# Patient Record
Sex: Male | Born: 1985 | Hispanic: No | Marital: Single | Smoking: Current every day smoker
Health system: Southern US, Community
[De-identification: ages and names within clinical notes are randomized; demographics above are authoritative.]

## PROBLEM LIST (undated history)

## (undated) DIAGNOSIS — J40 Bronchitis, not specified as acute or chronic: Secondary | ICD-10-CM

## (undated) DIAGNOSIS — D649 Anemia, unspecified: Secondary | ICD-10-CM

## (undated) DIAGNOSIS — Z720 Tobacco use: Secondary | ICD-10-CM

## (undated) HISTORY — PX: NO PAST SURGERIES: SHX2092

---
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
## 2006-04-12 ENCOUNTER — Emergency Department (HOSPITAL_COMMUNITY): Admission: EM | Admit: 2006-04-12 | Discharge: 2006-04-12 | Payer: Self-pay | Admitting: Emergency Medicine

## 2006-04-15 ENCOUNTER — Ambulatory Visit: Payer: Self-pay | Admitting: Family Medicine

## 2006-10-04 ENCOUNTER — Emergency Department (HOSPITAL_COMMUNITY): Admission: EM | Admit: 2006-10-04 | Discharge: 2006-10-04 | Payer: Self-pay | Admitting: Emergency Medicine

## 2007-02-18 ENCOUNTER — Emergency Department (HOSPITAL_COMMUNITY): Admission: EM | Admit: 2007-02-18 | Discharge: 2007-02-18 | Payer: Self-pay | Admitting: Family Medicine

## 2007-02-24 DIAGNOSIS — L708 Other acne: Secondary | ICD-10-CM | POA: Insufficient documentation

## 2007-02-24 DIAGNOSIS — F17200 Nicotine dependence, unspecified, uncomplicated: Secondary | ICD-10-CM | POA: Insufficient documentation

## 2007-02-24 DIAGNOSIS — J309 Allergic rhinitis, unspecified: Secondary | ICD-10-CM | POA: Insufficient documentation

## 2011-11-07 ENCOUNTER — Emergency Department (HOSPITAL_COMMUNITY)
Admission: EM | Admit: 2011-11-07 | Discharge: 2011-11-07 | Disposition: A | Discharge status: Home / Self Care | Payer: Self-pay | Attending: Emergency Medicine | Admitting: Emergency Medicine

## 2011-11-07 ENCOUNTER — Encounter: Payer: Self-pay | Admitting: *Deleted

## 2011-11-07 DIAGNOSIS — M79609 Pain in unspecified limb: Secondary | ICD-10-CM | POA: Insufficient documentation

## 2011-11-07 DIAGNOSIS — K625 Hemorrhage of anus and rectum: Secondary | ICD-10-CM | POA: Insufficient documentation

## 2011-11-07 DIAGNOSIS — F17200 Nicotine dependence, unspecified, uncomplicated: Secondary | ICD-10-CM | POA: Insufficient documentation

## 2011-11-07 DIAGNOSIS — M549 Dorsalgia, unspecified: Secondary | ICD-10-CM

## 2011-11-07 DIAGNOSIS — M545 Low back pain, unspecified: Secondary | ICD-10-CM | POA: Insufficient documentation

## 2011-11-07 LAB — POCT I-STAT, CHEM 8
BUN: 10 mg/dL (ref 6–23)
Calcium, Ion: 1.17 mmol/L (ref 1.12–1.32)
Creatinine, Ser: 1.1 mg/dL (ref 0.50–1.35)
Sodium: 140 mEq/L (ref 135–145)
TCO2: 26 mmol/L (ref 0–100)

## 2011-11-07 LAB — CBC
HCT: 42.4 % (ref 39.0–52.0)
Hemoglobin: 14.6 g/dL (ref 13.0–17.0)
MCH: 30.8 pg (ref 26.0–34.0)
MCV: 89.5 fL (ref 78.0–100.0)
RBC: 4.74 MIL/uL (ref 4.22–5.81)

## 2011-11-07 MED ORDER — OXYCODONE-ACETAMINOPHEN 5-325 MG PO TABS
2.0000 | ORAL_TABLET | Freq: Once | ORAL | Status: AC
  Administered 2011-11-07: 2 via ORAL
  Filled 2011-11-07 (×2): qty 1

## 2011-11-07 MED ORDER — OXYCODONE-ACETAMINOPHEN 5-325 MG PO TABS: 1.0000 | ORAL_TABLET | ORAL | Status: AC | PRN

## 2011-11-07 NOTE — ED Notes (Signed)
Pt started having back pain 3-4 days and took ibuprofen for it.  Then started having rectal bleeding yesterday and had another episode with clots yesterday.  Pt has hx of hemorrhoids.  Pt continues to have lower back pain that shoots down to knee caps

## 2011-11-07 NOTE — ED Provider Notes (Signed)
History     CSN: [id number] Arrival date & time: 11/07/2011 12:00 PM   First MD Initiated Contact with Patient 11/07/11 1523      Chief Complaint  Patient presents with  . Rectal Bleeding  . Back Pain    HPI Comments: Pt reports back pain for several days, no known injury No fever No h/o surgery to back Reports mild abd discomfort Also reports that he has had blood in his stool - no melena, no vomiting.  Does report h/o hemorrhoids  Patient is a 25 y.o. male presenting with back pain. The history is provided by the patient.  Back Pain  This is a new problem. The current episode started more than 2 days ago. The problem occurs hourly. The problem has been gradually worsening. The pain is associated with no known injury. The pain is present in the lumbar spine. The quality of the pain is described as shooting. The pain radiates to the right thigh and left thigh. The pain is moderate. The symptoms are aggravated by bending and certain positions. Associated symptoms include abdominal pain. Pertinent negatives include no chest pain, no fever, no numbness, no bowel incontinence, no perianal numbness, no bladder incontinence, no dysuria, no pelvic pain, no paresthesias, no paresis and no weakness.    PMH - none   History reviewed. No pertinent past surgical history.  No family history on file.  History  Substance Use Topics  . Smoking status: Current Everyday Smoker  . Smokeless tobacco: Not on file  . Alcohol Use: Yes     social      Review of Systems  Constitutional: Negative for fever.  Cardiovascular: Negative for chest pain.  Gastrointestinal: Positive for abdominal pain. Negative for bowel incontinence.  Genitourinary: Negative for bladder incontinence, dysuria and pelvic pain.  Musculoskeletal: Positive for back pain.  Neurological: Negative for weakness, numbness and paresthesias.  All other systems reviewed and are negative.    Allergies  Review of patient's  allergies indicates no known allergies.  Home Medications   Current Outpatient Rx  Name Route Sig Dispense Refill  . IBUPROFEN 200 MG PO TABS Oral Take 200 mg by mouth every 6 (six) hours as needed. For pain        BP 130/65  Pulse 84  Temp(Src) 98 F (36.7 C) (Oral)  Resp 20  SpO2 100%  Physical Exam  CONSTITUTIONAL: Well developed/well nourished HEAD AND FACE: Normocephalic/atraumatic EYES: EOMI/PERRL, conjunctiva pink ENMT: Mucous membranes moist NECK: supple no meningeal signs SPINE:thoracic spine nontender, lumbar spine tender, no bruising/crepitance CV: S1/S2 noted, no murmurs/rubs/gallops noted LUNGS: Lungs are clear to auscultation bilaterally, no apparent distress ABDOMEN: soft, nontender, no rebound or guarding GU:no cva tenderness Rectal - chaperone present, no melena, stool color normal, +hemoccult, +hemorrhoid noted that is not actively bleeding NEURO: Awake/alert, no saddle anesthesia, rectal tone present (chaperone present), equal distal motor: hip flexion/knee flexion/extension, ankle dorsi/plantar flexion, great toe extension intact bilaterally,no apparent sensory deficit in any dermatome.  Pt is able to ambulate. EXTREMITIES: pulses normal, full ROM SKIN: warm, color normal PSYCH: no abnormalities of mood noted   ED Course  Procedures (including critical care time)  Labs Reviewed  CBC - Abnormal; Notable for the following:    WBC 3.4 (*)    Platelets 127 (*)    All other components within normal limits  POCT I-STAT, CHEM 8 - Abnormal; Notable for the following:    Glucose, Bld 126 (*)    All other components within normal  limits  I-STAT, CHEM 8    3:45 PM Pt well appearing, no distress, suspicion for serious GI bleed is low Back pain likely due to muscle strain, no neuro deficits, otherwise healthy patient  4:40 PM Pt improved I told him to stop ibuprofen Also - he reports this pain started when bending over last week Discussed strict return  precautions  MDM  All labs/vitals reviewed and considered           Joya Gaskins, MD 11/07/11 [phone]

## 2018-02-23 ENCOUNTER — Emergency Department
Admission: EM | Admit: 2018-02-23 | Discharge: 2018-02-23 | Disposition: A | Discharge status: Home / Self Care | Payer: Self-pay | Attending: Emergency Medicine | Admitting: Emergency Medicine

## 2018-02-23 ENCOUNTER — Encounter: Payer: Self-pay | Admitting: Emergency Medicine

## 2018-02-23 ENCOUNTER — Emergency Department: Payer: Self-pay

## 2018-02-23 DIAGNOSIS — J111 Influenza due to unidentified influenza virus with other respiratory manifestations: Secondary | ICD-10-CM | POA: Insufficient documentation

## 2018-02-23 DIAGNOSIS — F17200 Nicotine dependence, unspecified, uncomplicated: Secondary | ICD-10-CM | POA: Insufficient documentation

## 2018-02-23 DIAGNOSIS — R69 Illness, unspecified: Secondary | ICD-10-CM

## 2018-02-23 HISTORY — DX: Bronchitis, not specified as acute or chronic: J40

## 2018-02-23 MED ORDER — GUAIFENESIN-CODEINE 100-10 MG/5ML PO SOLN: 5.0000 mL | ORAL | 0 refills | Status: DC | PRN

## 2018-02-23 MED ORDER — ACETAMINOPHEN 500 MG PO TABS
1000.0000 mg | ORAL_TABLET | Freq: Once | ORAL | Status: AC
  Administered 2018-02-23: 1000 mg via ORAL
  Filled 2018-02-23: qty 2

## 2018-02-23 NOTE — ED Notes (Signed)
Here for flu like symptoms including body aches, cough, congestion, subjective fever. Daughter dx with flu last night.

## 2018-02-23 NOTE — ED Provider Notes (Signed)
[facility] Emergency Department Provider Note  ____________________________________________   First MD Initiated Contact with Patient 02/23/18 1247     (approximate)  I have reviewed the triage vital signs and the nursing notes.   HISTORY  Chief Complaint Generalized Body Aches   HPI Douglas Patton is a 32 y.o. male is here with complaint of cough and congestion that started 3 days ago.  Patient has felt feverish and had chills.  He has been taken DayQuil and NyQuil at home.  Patient states that his daughter was recently diagnosed with the flu and he has similar symptoms.  Patient last took DayQuil at 8 AM this morning.  He denies any vomiting or diarrhea.  Patient is a smoker but states he has not smoked since he has been sick.  He rates his pain is not over 10.   Past Medical History:  Diagnosis Date  . Bronchitis     Patient Active Problem List   Diagnosis Date Noted  . TOBACCO DEPENDENCE 02/24/2007  . RHINITIS, ALLERGIC 02/24/2007  . ACNE 02/24/2007    History reviewed. No pertinent surgical history.  Prior to Admission medications   Medication Sig Start Date End Date Taking? Authorizing Provider  guaiFENesin-codeine 100-10 MG/5ML syrup Take 5 mLs by mouth every 4 (four) hours as needed. 02/23/18   Johnn Hai, PA-C  ibuprofen (ADVIL,MOTRIN) 200 MG tablet Take 200 mg by mouth every 6 (six) hours as needed. For pain      [provider]    Allergies Patient has no known allergies.  No family history on file.  Social History Social History   Tobacco Use  . Smoking status: Current Every Day Smoker  . Smokeless tobacco: Never Used  Substance Use Topics  . Alcohol use: Yes    Comment: social  . Drug use: No    Review of Systems Constitutional: Positive fever/chills Eyes: No visual changes. ENT: No sore throat. Cardiovascular: Denies chest pain. Respiratory: Denies shortness of breath.  Positive  cough. Gastrointestinal: No abdominal pain.  No nausea, no vomiting.  No diarrhea.  Musculoskeletal: Positive for muscle aches. Skin: Negative for rash. Neurological: Negative for headaches, focal weakness or numbness. ____________________________________________   PHYSICAL EXAM:  VITAL SIGNS: ED Triage Vitals [02/23/18 1232]  Enc Vitals Group     BP 131/69     Pulse Rate 87     Resp 16     Temp 99.1 F (37.3 C)     Temp Source Oral     SpO2 100 %     Weight 194 lb (88 kg)     Height 5\' 11"  (1.803 m)     Head Circumference      Peak Flow      Pain Score 9     Pain Loc      Pain Edu?      Excl. in [place]?    Constitutional: Alert and oriented. Well appearing and in no acute distress. Eyes: Conjunctivae are normal.  Head: Atraumatic. Nose: No congestion/rhinnorhea. Mouth/Throat: Mucous membranes are moist.  Oropharynx non-erythematous. Neck: No stridor.   Hematological/Lymphatic/Immunilogical: No cervical lymphadenopathy. Cardiovascular: Normal rate, regular rhythm. Grossly normal heart sounds.  Good peripheral circulation. Respiratory: Normal respiratory effort.  No retractions. Lungs CTAB. Gastrointestinal: Soft and nontender. No distention.  Musculoskeletal: Moves upper and lower extremities slowly.  He was able to sit up with the aid of a family member. Neurologic:  Normal speech and language. No gross focal neurologic deficits are  appreciated. No gait instability. Skin:  Skin is warm, dry and intact. No rash noted. Psychiatric: Mood and affect are normal. Speech and behavior are normal.  ____________________________________________   LABS (all labs ordered are listed, but only abnormal results are displayed)  Labs Reviewed - No data to display RADIOLOGY  ED MD interpretation:  No evidence of pneumonia on chest x-ray.  Official radiology report(s): Dg Chest 2 View  Result Date: 02/23/2018 CLINICAL DATA:  Fever and cough for 3 days EXAM: CHEST  2 VIEW  COMPARISON:  None. FINDINGS: The heart size and mediastinal contours are within normal limits. Both lungs are clear. The visualized skeletal structures are unremarkable. IMPRESSION: No active cardiopulmonary disease. Electronically Signed   By: Donavan Foil M.D.   On: 02/23/2018 14:07    ____________________________________________   PROCEDURES  Procedure(s) performed: None  Procedures  Critical Care performed: No  ____________________________________________   INITIAL IMPRESSION / ASSESSMENT AND PLAN / ED COURSE  Patient is already been sick for a total of 5 days and is not going to benefit from Tamiflu.  Family member with patient voices understanding.  Patient was given prescription for guaifenesin with codeine to take as needed for cough and congestion.  He is encouraged to increase fluids.  He was given Tylenol while in the department as he had not taken any medications today.  He will follow-up with [facility] acute care if any continued problems.  ____________________________________________   FINAL CLINICAL IMPRESSION(S) / ED DIAGNOSES  Final diagnoses:  Influenza-like illness     ED Discharge Orders        Ordered    guaiFENesin-codeine 100-10 MG/5ML syrup  Every 4 hours PRN     02/23/18 1417       Note:  This document was prepared using Dragon voice recognition software and may include unintentional dictation errors.    Johnn Hai, PA-C 02/23/18 1454    Earleen Newport, MD 02/23/18 [phone]

## 2018-02-23 NOTE — Discharge Instructions (Signed)
Follow-up with [facility] acute care if any continued problems or your primary care doctor of choice.  Continue Tylenol and ibuprofen as needed for body aches or fever.  Increase fluids.  Robitussin-AC as needed for cough and congestion.

## 2018-02-23 NOTE — ED Notes (Signed)
Pt taken to the POV in wheelchair by his S/O. VSS. NAD. Discharge instructions, RX and follow up discussed. Will continue to monitor for changes.

## 2018-02-23 NOTE — ED Triage Notes (Signed)
Pt comes into the ED via POV c/o fever, generalized body aches, and cough.  Patient's daughter recently diagnosed with flu as well based off of symptoms.  Patient states he has ben taking dayquil with no relief and he keeps having fevers at home.  Dayquil was last given at 8:00am.  Patient in NAD at this time with even and unlabored respirations.

## 2018-03-02 ENCOUNTER — Other Ambulatory Visit: Payer: Self-pay

## 2018-03-02 ENCOUNTER — Encounter: Payer: Self-pay | Admitting: Emergency Medicine

## 2018-03-02 ENCOUNTER — Inpatient Hospital Stay
Admission: EM | Admit: 2018-03-02 | Discharge: 2018-03-04 | DRG: 378 | Disposition: A | Discharge status: Home / Self Care | Payer: Self-pay | Attending: Internal Medicine | Admitting: Internal Medicine

## 2018-03-02 DIAGNOSIS — K922 Gastrointestinal hemorrhage, unspecified: Secondary | ICD-10-CM

## 2018-03-02 DIAGNOSIS — T39395A Adverse effect of other nonsteroidal anti-inflammatory drugs [NSAID], initial encounter: Secondary | ICD-10-CM | POA: Diagnosis present

## 2018-03-02 DIAGNOSIS — D509 Iron deficiency anemia, unspecified: Secondary | ICD-10-CM | POA: Diagnosis present

## 2018-03-02 DIAGNOSIS — K219 Gastro-esophageal reflux disease without esophagitis: Secondary | ICD-10-CM | POA: Diagnosis present

## 2018-03-02 DIAGNOSIS — K921 Melena: Principal | ICD-10-CM | POA: Diagnosis present

## 2018-03-02 DIAGNOSIS — K642 Third degree hemorrhoids: Secondary | ICD-10-CM | POA: Diagnosis present

## 2018-03-02 DIAGNOSIS — D62 Acute posthemorrhagic anemia: Secondary | ICD-10-CM | POA: Diagnosis present

## 2018-03-02 DIAGNOSIS — D649 Anemia, unspecified: Secondary | ICD-10-CM | POA: Diagnosis present

## 2018-03-02 DIAGNOSIS — K648 Other hemorrhoids: Secondary | ICD-10-CM | POA: Diagnosis present

## 2018-03-02 DIAGNOSIS — D124 Benign neoplasm of descending colon: Secondary | ICD-10-CM | POA: Diagnosis present

## 2018-03-02 DIAGNOSIS — R55 Syncope and collapse: Secondary | ICD-10-CM

## 2018-03-02 DIAGNOSIS — K5730 Diverticulosis of large intestine without perforation or abscess without bleeding: Secondary | ICD-10-CM | POA: Diagnosis present

## 2018-03-02 DIAGNOSIS — F17200 Nicotine dependence, unspecified, uncomplicated: Secondary | ICD-10-CM | POA: Diagnosis present

## 2018-03-02 HISTORY — DX: Tobacco use: Z72.0

## 2018-03-02 LAB — BASIC METABOLIC PANEL
ANION GAP: 12 (ref 5–15)
BUN: 14 mg/dL (ref 6–20)
CHLORIDE: 104 mmol/L (ref 101–111)
CO2: 24 mmol/L (ref 22–32)
Calcium: 9 mg/dL (ref 8.9–10.3)
Creatinine, Ser: 1.17 mg/dL (ref 0.61–1.24)
GFR calc Af Amer: >60 mL/min (ref >60)
GFR calc non Af Amer: >60 mL/min (ref >60)
Glucose, Bld: 95 mg/dL (ref 65–99)
POTASSIUM: 4.2 mmol/L (ref 3.5–5.1)
SODIUM: 140 mmol/L (ref 135–145)

## 2018-03-02 LAB — PROTIME-INR
INR: 1
Prothrombin Time: 13.1 seconds (ref 11.4–15.2)

## 2018-03-02 LAB — ABO/RH: ABO/RH(D): O POS

## 2018-03-02 LAB — GLUCOSE, CAPILLARY: GLUCOSE-CAPILLARY: 99 mg/dL (ref 65–99)

## 2018-03-02 LAB — URINALYSIS, COMPLETE (UACMP) WITH MICROSCOPIC
BACTERIA UA: NONE SEEN
BILIRUBIN URINE: NEGATIVE
Glucose, UA: NEGATIVE mg/dL
HGB URINE DIPSTICK: NEGATIVE
KETONES UR: NEGATIVE mg/dL
Leukocytes, UA: NEGATIVE
NITRITE: NEGATIVE
PROTEIN: NEGATIVE mg/dL
Specific Gravity, Urine: 1.018 (ref 1.005–1.030)
pH: 7 (ref 5.0–8.0)

## 2018-03-02 LAB — CBC
HEMATOCRIT: 19.2 % — AB (ref 40.0–52.0)
HEMOGLOBIN: 5.7 g/dL — AB (ref 13.0–18.0)
MCH: 20.2 pg — ABNORMAL LOW (ref 26.0–34.0)
MCHC: 29.8 g/dL — ABNORMAL LOW (ref 32.0–36.0)
MCV: 67.7 fL — AB (ref 80.0–100.0)
Platelets: 467 10*3/uL — ABNORMAL HIGH (ref 150–440)
RBC: 2.83 MIL/uL — AB (ref 4.40–5.90)
RDW: 21.4 % — ABNORMAL HIGH (ref 11.5–14.5)
WBC: 8.2 10*3/uL (ref 3.8–10.6)

## 2018-03-02 LAB — APTT: aPTT: 26 seconds (ref 24–36)

## 2018-03-02 LAB — TROPONIN I

## 2018-03-02 MED ORDER — SODIUM CHLORIDE 0.9 % IV SOLN
8.0000 mg/h | INTRAVENOUS | Status: DC
  Administered 2018-03-03: 8 mg/h via INTRAVENOUS
  Filled 2018-03-02 (×2): qty 80

## 2018-03-02 MED ORDER — SODIUM CHLORIDE 0.9 % IV SOLN
80.0000 mg | Freq: Once | INTRAVENOUS | Status: AC
  Administered 2018-03-03: 80 mg via INTRAVENOUS
  Filled 2018-03-02: qty 80

## 2018-03-02 MED ORDER — PANTOPRAZOLE SODIUM 40 MG IV SOLR: 40.0000 mg | Freq: Two times a day (BID) | INTRAVENOUS | Status: DC

## 2018-03-02 MED ORDER — SODIUM CHLORIDE 0.9 % IV SOLN
10.0000 mL/h | Freq: Once | INTRAVENOUS | Status: AC
  Administered 2018-03-03: 10 mL/h via INTRAVENOUS

## 2018-03-02 NOTE — H&P (Signed)
[place] [facility] [place] NAME: Douglas Patton    MR#:  [id number]  DATE OF BIRTH:  1986/02/21  DATE OF ADMISSION:  03/02/2018  PRIMARY CARE PHYSICIAN: Patient, No Pcp Per   REQUESTING/REFERRING PHYSICIAN: Burlene Arnt, MD  CHIEF COMPLAINT:   Chief Complaint  Patient presents with  . Near Syncope    HISTORY OF PRESENT ILLNESS:  Douglas Patton  is a 32 y.o. male who presents with symptomatic anemia.  Patient has a long-standing history of rectal bleeding.  He says that he was evaluated for this before in the emergency department one time when he had a significant bleeding episode.  He states that he was told he had hemorrhoids.  He states that this examination consisted only of a digital rectal exam, not any sort of colonoscopy or endoscopy.  He states he has been having sporadic episodes of bleeding like this since he was 15.  He has noted sometimes in the past when he takes ibuprofen it will get worse.  Lately he has been having headaches, and tonight he had some episodes of lightheadedness.  On evaluation in the ED his hemoglobin was 5.7.  He does say that over the last several days he has had frequent back to back episodes of bloody stool.  Hospitalist were called for admission  PAST MEDICAL HISTORY:   Past Medical History:  Diagnosis Date  . Bronchitis   . Tobacco abuse      PAST SURGICAL HISTORY:   Past Surgical History:  Procedure Laterality Date  . NO PAST SURGERIES       SOCIAL HISTORY:   Social History   Tobacco Use  . Smoking status: Current Every Day Smoker  . Smokeless tobacco: Never Used  Substance Use Topics  . Alcohol use: Yes    Comment: social     FAMILY HISTORY:  No family history on file.  Family history reviewed and non contributory DRUG ALLERGIES:  No Known Allergies  MEDICATIONS AT HOME:   Prior to Admission medications   Medication Sig Start Date End Date Taking? Authorizing Provider   guaiFENesin-codeine 100-10 MG/5ML syrup Take 5 mLs by mouth every 4 (four) hours as needed. 02/23/18   Johnn Hai, PA-C  ibuprofen (ADVIL,MOTRIN) 200 MG tablet Take 200 mg by mouth every 6 (six) hours as needed. For pain      [provider]    REVIEW OF SYSTEMS:  Review of Systems  Constitutional: Negative for chills, fever, malaise/fatigue and weight loss.  HENT: Negative for ear pain, hearing loss and tinnitus.   Eyes: Negative for blurred vision, double vision, pain and redness.  Respiratory: Negative for cough, hemoptysis and shortness of breath.   Cardiovascular: Negative for chest pain, palpitations, orthopnea and leg swelling.  Gastrointestinal: Positive for blood in stool. Negative for abdominal pain, constipation, diarrhea, nausea and vomiting.  Genitourinary: Negative for dysuria, frequency and hematuria.  Musculoskeletal: Negative for back pain, joint pain and neck pain.  Skin:       No acne, rash, or lesions  Neurological: Negative for dizziness, tremors, focal weakness and weakness.       Lightheadedness  Endo/Heme/Allergies: Negative for polydipsia. Does not bruise/bleed easily.  Psychiatric/Behavioral: Negative for depression. The patient is not nervous/anxious and does not have insomnia.      VITAL SIGNS:   Vitals:   03/02/18 2125 03/02/18 2244 03/02/18 2247 03/02/18 2259  BP: 138/75   122/66  Pulse: (!) 105 94 (!) 105 85  Resp: (!) 22 14 17 18   Temp: 98.6 F (37 C)     TempSrc: Oral     SpO2: 100% 100% 100% 100%  Weight: 88 kg (194 lb)     Height: 5\' 11"  (1.803 m)      Wt Readings from Last 3 Encounters:  03/02/18 88 kg (194 lb)  02/23/18 88 kg (194 lb)    PHYSICAL EXAMINATION:  Physical Exam  Vitals reviewed. Constitutional: He is oriented to person, place, and time. He appears well-developed and well-nourished. No distress.  HENT:  Head: Normocephalic and atraumatic.  Mouth/Throat: Oropharynx is clear and moist.  Eyes: EOM are  normal. Pupils are equal, round, and reactive to light. No scleral icterus.  Neck: Normal range of motion. Neck supple. No JVD present. No thyromegaly present.  Cardiovascular: Normal rate, regular rhythm and intact distal pulses. Exam reveals no gallop and no friction rub.  No murmur heard. Respiratory: Effort normal and breath sounds normal. No respiratory distress. He has no wheezes. He has no rales.  GI: Soft. Bowel sounds are normal. He exhibits no distension. There is no tenderness.  Musculoskeletal: Normal range of motion. He exhibits no edema.  No arthritis, no gout  Lymphadenopathy:    He has no cervical adenopathy.  Neurological: He is alert and oriented to person, place, and time. No cranial nerve deficit.  No dysarthria, no aphasia  Skin: Skin is warm and dry. No rash noted. No erythema.  Psychiatric: He has a normal mood and affect. His behavior is normal. Judgment and thought content normal.    LABORATORY PANEL:   CBC Recent Labs  Lab 03/02/18 2127  WBC 8.2  HGB 5.7*  HCT 19.2*  PLT 467*   ------------------------------------------------------------------------------------------------------------------  Chemistries  Recent Labs  Lab 03/02/18 2127  NA 140  K 4.2  CL 104  CO2 24  GLUCOSE 95  BUN 14  CREATININE 1.17  CALCIUM 9.0   ------------------------------------------------------------------------------------------------------------------  Cardiac Enzymes Recent Labs  Lab 03/02/18 2127  TROPONINI <0.03   ------------------------------------------------------------------------------------------------------------------  RADIOLOGY:  No results found.  EKG:   Orders placed or performed during the hospital encounter of 03/02/18  . ED EKG  . ED EKG  . EKG 12-Lead  . EKG 12-Lead    IMPRESSION AND PLAN:  Principal Problem:   GI bleed -unclear etiology.  Patient denies any significant abdominal pain.  He states that he has not noticed any large  amount of melena, but that his blood has been red blood in his stools.  We will keep him on a PPI drip tonight, though I do suspect this is likely lower GI pathology.  GI consult ordered Active Problems:   Symptomatic anemia -due to his GI bleed as above.  2 units PRBC ordered for transfusion.  Serial hemoglobin checks  All the records are reviewed and case discussed with ED provider. Management plans discussed with the patient and/or family.  DVT PROPHYLAXIS: Mechanical only  GI PROPHYLAXIS: PPI  ADMISSION STATUS: Inpatient  CODE STATUS: Full Code Status History    This patient does not have a recorded code status. Please follow your organizational policy for patients in this situation.      TOTAL TIME TAKING CARE OF THIS PATIENT: 45 minutes.   [facility] [place] 03/02/2018, 11:28 PM  [facility]  Office  [phone]  CC: Primary care physician; Patient, No Pcp Per  Note:  This document was prepared using Dragon voice recognition software and may include unintentional dictation errors.

## 2018-03-02 NOTE — ED Provider Notes (Signed)
[facility] Emergency Department Provider Note  ____________________________________________   I have reviewed the triage vital signs and the nursing notes. Where available I have reviewed prior notes and, if possible and indicated, outside hospital notes.    HISTORY  Chief Complaint Near Syncope    HPI Douglas Patton is a 32 y.o. male who presents today complaining of feeling weak and dizzy for the last week or so.  He was seen about a week ago for flulike symptoms, but even before that he has been feeling generally not himself.  Patient does have a history of rectal bleeding from hemorrhoids he states he had colonoscopy sometime in the last decade in [place] which showed hemorrhoids only.  Patient does take a large number of NSAIDs he states, for his headaches, he has chronic recurrent headaches he states he is not having headache today fortunately.  He states that he has had bright red blood per rectum off and on for several months.  His last episode was yesterday.  He has not had melena or hematemesis.  Denies abdominal pain.  He denies taking blood thinners otherwise he denies easy bleeding or bruising, he is here because he was feeling lightheaded.  He has been feeling more progressively lightheaded over the week.  Is not actually passed out.  He denies any chest pain.  No prior treatment,    Past Medical History:  Diagnosis Date  . Bronchitis     Patient Active Problem List   Diagnosis Date Noted  . TOBACCO DEPENDENCE 02/24/2007  . RHINITIS, ALLERGIC 02/24/2007  . ACNE 02/24/2007    History reviewed. No pertinent surgical history.  Prior to Admission medications   Medication Sig Start Date End Date Taking? Authorizing Provider  guaiFENesin-codeine 100-10 MG/5ML syrup Take 5 mLs by mouth every 4 (four) hours as needed. 02/23/18   Johnn Hai, PA-C  ibuprofen (ADVIL,MOTRIN) 200 MG tablet Take 200 mg by mouth every 6 (six) hours as needed. For  pain      [provider]    Allergies Patient has no known allergies.  No family history on file.  Social History Social History   Tobacco Use  . Smoking status: Current Every Day Smoker  . Smokeless tobacco: Never Used  Substance Use Topics  . Alcohol use: Yes    Comment: social  . Drug use: No    Review of Systems Constitutional: No fever/chills Eyes: No visual changes. ENT: No sore throat. No stiff neck no neck pain Cardiovascular: Denies chest pain. Respiratory: Denies shortness of breath. Gastrointestinal:   no vomiting.  No diarrhea.  No constipation. Genitourinary: Negative for dysuria. Musculoskeletal: Negative lower extremity swelling Skin: Negative for rash. Neurological: Negative for severe headaches, focal weakness or numbness.   ____________________________________________   PHYSICAL EXAM:  VITAL SIGNS: ED Triage Vitals  Enc Vitals Group     BP 03/02/18 2125 138/75     Pulse Rate 03/02/18 2125 (!) 105     Resp 03/02/18 2125 (!) 22     Temp 03/02/18 2125 98.6 F (37 C)     Temp Source 03/02/18 2125 Oral     SpO2 03/02/18 2125 100 %     Weight 03/02/18 2125 194 lb (88 kg)     Height 03/02/18 2125 5\' 11"  (1.803 m)     Head Circumference --      Peak Flow --      Pain Score 03/02/18 2227 8     Pain Loc --  Pain Edu? --      Excl. in [place]? --     Constitutional: Alert and oriented. Well appearing and in no acute distress. Eyes: Conjunctivae are normal Head: Atraumatic HEENT: No congestion/rhinnorhea. Mucous membranes are moist.  Oropharynx non-erythematous Neck:   Nontender with no meningismus, no masses, no stridor Cardiovascular: Normal rate, regular rhythm. Grossly normal heart sounds.  Good peripheral circulation. Respiratory: Normal respiratory effort.  No retractions. Lungs CTAB. Abdominal: Soft and nontender. No distention. No guarding no rebound Back:  There is no focal tenderness or step off.  there is no midline  tenderness there are no lesions noted. there is no CVA tenderness Rectal exam, evidence of old hemorrhoids seen, no inflammation, very faintly guaiac positive Musculoskeletal: No lower extremity tenderness, no upper extremity tenderness. No joint effusions, no DVT signs strong distal pulses no edema Neurologic:  Normal speech and language. No gross focal neurologic deficits are appreciated.  Skin:  Skin is warm, dry and intact. No rash noted. Psychiatric: Mood and affect are normal. Speech and behavior are normal.  ____________________________________________   LABS (all labs ordered are listed, but only abnormal results are displayed)  Labs Reviewed  CBC - Abnormal; Notable for the following components:      Result Value   RBC 2.83 (*)    Hemoglobin 5.7 (*)    HCT 19.2 (*)    MCV 67.7 (*)    MCH 20.2 (*)    MCHC 29.8 (*)    RDW 21.4 (*)    Platelets 467 (*)    All other components within normal limits  URINALYSIS, COMPLETE (UACMP) WITH MICROSCOPIC - Abnormal; Notable for the following components:   Color, Urine YELLOW (*)    APPearance CLEAR (*)    Squamous Epithelial / LPF 0-5 (*)    All other components within normal limits  BASIC METABOLIC PANEL  TROPONIN I  GLUCOSE, CAPILLARY  APTT  PROTIME-INR  CBG MONITORING, ED  PREPARE RBC (CROSSMATCH)  TYPE AND SCREEN    Pertinent labs  results that were available during my care of the patient were reviewed by me and considered in my medical decision making (see chart for details). ____________________________________________  EKG  I personally interpreted any EKGs ordered by me or triage Normal sinus rhythm rate 91 bpm no acute ST elevation or depression normal axis unremarkable EKG ____________________________________________  RADIOLOGY  Pertinent labs & imaging results that were available during my care of the patient were reviewed by me and considered in my medical decision making (see chart for details). If possible,  patient and/or family made aware of any abnormal findings.  No results found. ____________________________________________    PROCEDURES  Procedure(s) performed: None  Procedures  Critical Care performed: CRITICAL CARE Performed by: Schuyler Amor   Total critical care time: 45 minutes  Critical care time was exclusive of separately billable procedures and treating other patients.  Critical care was necessary to treat or prevent imminent or life-threatening deterioration.  Critical care was time spent personally by me on the following activities: development of treatment plan with patient and/or surrogate as well as nursing, discussions with consultants, evaluation of patient's response to treatment, examination of patient, obtaining history from patient or surrogate, ordering and performing treatments and interventions, ordering and review of laboratory studies, ordering and review of radiographic studies, pulse oximetry and re-evaluation of patient's condition.   ____________________________________________   INITIAL IMPRESSION / ASSESSMENT AND PLAN / ED COURSE  Pertinent labs & imaging results that were available  during my care of the patient were reviewed by me and considered in my medical decision making (see chart for details).  Patient here with very low hemoglobin which is acute on chronic in nature.  He is vital signs are reassuring.  He is somewhat anxious and we have talked him through a panic attack here.  He does have a history of panic attacks.  I do not think he is in DIC or some sort of consumptive dyscrasia this is clearly most likely a rectal bleed.  He does consent to blood risk benefits and alternatives to blood transfusion has been explained and understood, patient will be admitted for further evaluation.   ____________________________________________   FINAL CLINICAL IMPRESSION(S) / ED DIAGNOSES  Final diagnoses:  None      This chart was dictated  using voice recognition software.  Despite best efforts to proofread,  errors can occur which can change meaning.      Schuyler Amor, MD 03/02/18 [phone]

## 2018-03-02 NOTE — ED Triage Notes (Signed)
Pt to triage via w/c with no distress noted; reports onset feeling "sick and dizzy" at work; reports persistent dizziness at present; st similar episode at home few nights ago

## 2018-03-03 ENCOUNTER — Other Ambulatory Visit: Payer: Self-pay

## 2018-03-03 DIAGNOSIS — R55 Syncope and collapse: Secondary | ICD-10-CM

## 2018-03-03 DIAGNOSIS — K625 Hemorrhage of anus and rectum: Secondary | ICD-10-CM

## 2018-03-03 DIAGNOSIS — D500 Iron deficiency anemia secondary to blood loss (chronic): Secondary | ICD-10-CM

## 2018-03-03 LAB — IRON AND TIBC
Iron: 10 ug/dL — ABNORMAL LOW (ref 45–182)
Saturation Ratios: 3 % — ABNORMAL LOW (ref 17.9–39.5)
TIBC: 406 ug/dL (ref 250–450)
UIBC: 396 ug/dL

## 2018-03-03 LAB — FERRITIN: FERRITIN: 2 ng/mL — AB (ref 24–336)

## 2018-03-03 LAB — CBC
HCT: 22.2 % — ABNORMAL LOW (ref 40.0–52.0)
Hemoglobin: 6.8 g/dL — ABNORMAL LOW (ref 13.0–18.0)
MCH: 22.1 pg — ABNORMAL LOW (ref 26.0–34.0)
MCHC: 30.5 g/dL — AB (ref 32.0–36.0)
MCV: 72.4 fL — ABNORMAL LOW (ref 80.0–100.0)
Platelets: 386 10*3/uL (ref 150–440)
RBC: 3.06 MIL/uL — ABNORMAL LOW (ref 4.40–5.90)
RDW: 23.2 % — AB (ref 11.5–14.5)
WBC: 6.4 10*3/uL (ref 3.8–10.6)

## 2018-03-03 LAB — HEPATIC FUNCTION PANEL
ALBUMIN: 3.5 g/dL (ref 3.5–5.0)
ALK PHOS: 57 U/L (ref 38–126)
ALT: 25 U/L (ref 17–63)
AST: 23 U/L (ref 15–41)
BILIRUBIN TOTAL: 0.5 mg/dL (ref 0.3–1.2)
Bilirubin, Direct: <0.1 mg/dL — ABNORMAL LOW (ref 0.1–0.5)
Total Protein: 6.6 g/dL (ref 6.5–8.1)

## 2018-03-03 LAB — BASIC METABOLIC PANEL
Anion gap: 9 (ref 5–15)
BUN: 14 mg/dL (ref 6–20)
CO2: 22 mmol/L (ref 22–32)
CREATININE: 1.01 mg/dL (ref 0.61–1.24)
Calcium: 8.4 mg/dL — ABNORMAL LOW (ref 8.9–10.3)
Chloride: 106 mmol/L (ref 101–111)
GFR calc Af Amer: >60 mL/min (ref >60)
Glucose, Bld: 113 mg/dL — ABNORMAL HIGH (ref 65–99)
Potassium: 3.9 mmol/L (ref 3.5–5.1)
SODIUM: 137 mmol/L (ref 135–145)

## 2018-03-03 LAB — VITAMIN B12: VITAMIN B 12: 216 pg/mL (ref 180–914)

## 2018-03-03 LAB — FOLATE: Folate: 6.2 ng/mL (ref >5.9)

## 2018-03-03 LAB — PREPARE RBC (CROSSMATCH)

## 2018-03-03 LAB — HEMOGLOBIN AND HEMATOCRIT, BLOOD
HCT: 25.4 % — ABNORMAL LOW (ref 40.0–52.0)
Hemoglobin: 8.1 g/dL — ABNORMAL LOW (ref 13.0–18.0)

## 2018-03-03 LAB — C-REACTIVE PROTEIN: CRP: <0.8 mg/dL (ref <1.0)

## 2018-03-03 LAB — SEDIMENTATION RATE: SED RATE: 45 mm/h — AB (ref 0–15)

## 2018-03-03 LAB — HEMOGLOBIN: Hemoglobin: 8.2 g/dL — ABNORMAL LOW (ref 13.0–18.0)

## 2018-03-03 MED ORDER — ACETAMINOPHEN 650 MG RE SUPP: 650.0000 mg | Freq: Four times a day (QID) | RECTAL | Status: DC | PRN

## 2018-03-03 MED ORDER — ONDANSETRON HCL 4 MG/2ML IJ SOLN: 4.0000 mg | Freq: Four times a day (QID) | INTRAMUSCULAR | Status: DC | PRN

## 2018-03-03 MED ORDER — SODIUM CHLORIDE 0.9 % IV SOLN
510.0000 mg | Freq: Once | INTRAVENOUS | Status: AC
  Administered 2018-03-03: 510 mg via INTRAVENOUS
  Filled 2018-03-03: qty 17

## 2018-03-03 MED ORDER — PEG 3350-KCL-NA BICARB-NACL 420 G PO SOLR
4000.0000 mL | Freq: Once | ORAL | Status: AC
  Administered 2018-03-03: 4000 mL via ORAL
  Filled 2018-03-03: qty 4000

## 2018-03-03 MED ORDER — ONDANSETRON HCL 4 MG PO TABS: 4.0000 mg | ORAL_TABLET | Freq: Four times a day (QID) | ORAL | Status: DC | PRN

## 2018-03-03 MED ORDER — ACETAMINOPHEN 325 MG PO TABS: 650.0000 mg | ORAL_TABLET | Freq: Four times a day (QID) | ORAL | Status: DC | PRN

## 2018-03-03 MED ORDER — SODIUM CHLORIDE 0.9 % IV SOLN
Freq: Once | INTRAVENOUS | Status: AC
  Administered 2018-03-03: 10:00:00 via INTRAVENOUS

## 2018-03-03 NOTE — Progress Notes (Addendum)
[place] [facility] [place] NAME: Douglas Patton    MR#:  [id number]  DATE OF BIRTH:  11/14/1986  SUBJECTIVE:   Patient came in with near syncopal episode at work.  He was found to have hemoglobin of 5.7.  Takes every now and then and ibuprofen for his chronic headaches.  Complains of bright red blood per rectum for many years on and off.  No renal evaluation done in the past.  Denies any abdominal pain.  Family in the room. REVIEW OF SYSTEMS:   Review of Systems  Constitutional: Negative for chills, fever and weight loss.  HENT: Negative for ear discharge, ear pain and nosebleeds.   Eyes: Negative for blurred vision, pain and discharge.  Respiratory: Negative for sputum production, shortness of breath, wheezing and stridor.   Cardiovascular: Negative for chest pain, palpitations, orthopnea and PND.  Gastrointestinal: Positive for blood in stool and melena. Negative for abdominal pain, diarrhea, nausea and vomiting.  Genitourinary: Negative for frequency and urgency.  Musculoskeletal: Negative for back pain and joint pain.  Neurological: Negative for sensory change, speech change, focal weakness and weakness.  Psychiatric/Behavioral: Negative for depression and hallucinations. The patient is not nervous/anxious.    Tolerating Diet:npo Tolerating BP:ZWCHENIDPO DRUG ALLERGIES:  No Known Allergies  VITALS:  Blood pressure (!) 122/54, pulse 64, temperature 98.1 F (36.7 C), temperature source Oral, resp. rate 18, height 5\' 11"  (1.803 m), weight 88 kg (194 lb), SpO2 100 %.  PHYSICAL EXAMINATION:   Physical Exam  GENERAL:  32 y.o.-year-old patient lying in the bed with no acute distress. Pallor + EYES: Pupils equal, round, reactive to light and accommodation. No scleral icterus. Extraocular muscles intact.  HEENT: Head atraumatic, normocephalic. Oropharynx and nasopharynx clear.  NECK:  Supple, no jugular venous distention. No thyroid  enlargement, no tenderness.  LUNGS: Normal breath sounds bilaterally, no wheezing, rales, rhonchi. No use of accessory muscles of respiration.  CARDIOVASCULAR: S1, S2 normal. No murmurs, rubs, or gallops.  ABDOMEN: Soft, nontender, nondistended. Bowel sounds present. No organomegaly or mass.  EXTREMITIES: No cyanosis, clubbing or edema b/l.    NEUROLOGIC: Cranial nerves II through XII are intact. No focal Motor or sensory deficits b/l.   PSYCHIATRIC:  patient is alert and oriented x 3.  SKIN: No obvious rash, lesion, or ulcer.   LABORATORY PANEL:  CBC Recent Labs  Lab 03/02/18 2127  WBC 8.2  HGB 5.7*  HCT 19.2*  PLT 467*    Chemistries  Recent Labs  Lab 03/03/18 0110  NA 137  K 3.9  CL 106  CO2 22  GLUCOSE 113*  BUN 14  CREATININE 1.01  CALCIUM 8.4*   Cardiac Enzymes Recent Labs  Lab 03/02/18 2127  TROPONINI <0.03   RADIOLOGY:  No results found. ASSESSMENT AND PLAN:   Maninder Deboer  is a 32 y.o. male who presents with symptomatic anemia.  Patient has a long-standing history of rectal bleeding.  He says that he was evaluated for this before in the emergency department one time when he had a significant bleeding episode.  He states that he was told he had hemorrhoids  1.  Near syncope secondary to symptomatic anemia -She came in with hemoglobin of 5.7--- 2 unit blood transfusion--hemoglobin pending -Continue IV Protonix drip -GI consultation placed. -Takes NSAIDs on and off for headache  2.  History of hemorrhoids -On and off rectal bleeding.  No luminal evaluation done in the past -We will give stool softeners to  avoid constipation -Await GI eval  3.  Tobacco abuse Recommended smoking cessation   Case discussed with Care Management/Social Worker. Management plans discussed with the patient, family and they are in agreement.  CODE STATUS: Full  DVT Prophylaxis: SCD  TOTAL TIME TAKING CARE OF THIS PATIENT: *30* minutes.  >50% time spent on  counselling and coordination of care  POSSIBLE D/C IN 1-2 DAYS, DEPENDING ON CLINICAL CONDITION.  Note: This dictation was prepared with Dragon dictation along with smaller phrase technology. Any transcriptional errors that result from this process are unintentional.  Fritzi Mandes M.D on 03/03/2018 at 8:37 AM  Between 7am to 6pm - Pager - [phone]  After 6pm go to [email or web address] - password [email or web address] [facility]  Office  [phone]  CC: Primary care physician; Patient, No Pcp PerPatient ID: DAVON ABDELAZIZ, male   DOB: [birth year], 32 y.o.   MRN: [id number]

## 2018-03-03 NOTE — Progress Notes (Signed)
Initial Nutrition Assessment  DOCUMENTATION CODES:   Not applicable  INTERVENTION:   RD will monitor for diet advancement   NUTRITION DIAGNOSIS:   Unintentional weight loss related to (unknown etiology) as evidenced by 12 percent weight loss in 4-6 months.   GOAL:   Patient will meet greater than or equal to 90% of their needs  MONITOR:   Labs, Weight trends, I & O's, Supplement acceptance, PO intake  REASON FOR ASSESSMENT:   Malnutrition Screening Tool    ASSESSMENT:    32 y.o. African-American male with no significant past medical history presents with presyncope in the setting of severe iron deficiency anemia and chronic history of intermittent, painless rectal bleeding, unintentional weight loss   Met with pt in room today. Pt reports good appetite and oral intake pta. Pt reports that he has been eating "crazy amounts of food" but has been losing weight. Pt reports his UBW is ~194lbs; pt reports that he has lost ~25lbs over the last 4-6 months. This is severe weight loss. There is no weight history in chart to confirm weight loss and admit weight appears reported. Pt would like to have Premier Protein and double protein with meals when diet advanced. Pt NPO today for EGD and colonoscopy tomorrow.     Medications reviewed and include: protonix, feraheme  Labs reviewed: Ca 8.4(L) Iron 10(L), TIBC 406 wnl, ferritin 2.0(L), folate 6.2 wnl B12- 216 wnl Hgb 6.8(L), Hct 22.2(L), MCV 72.4(L), MCH 22.1(L), MCHC 30.5(L)  Unable to complete Nutrition-Focused physical exam at this time.   Diet Order:  Diet clear liquid Room service appropriate? Yes; Fluid consistency: Thin  EDUCATION NEEDS:   Education needs have been addressed  Skin:  Reviewed RN Assessment  Last BM:  3/6  Height:   Ht Readings from Last 1 Encounters:  03/02/18 5' 11" (1.803 m)    Weight:   Wt Readings from Last 1 Encounters:  03/02/18 194 lb (88 kg)    Ideal Body Weight:  78.18 kg  BMI:   Body mass index is 27.06 kg/m.  Estimated Nutritional Needs:   Kcal:  2100-2400kcal/day   Protein:  85-100g/day   Fluid:  >2L/day   Casey Campbell MS, RD, LDN Pager #- [phone] After Hours Pager: [phone]  

## 2018-03-03 NOTE — Consult Note (Signed)
Cephas Darby, MD [address]  [place]  [place], [place] [address]  Main: [phone]  Fax: [phone] Pager: [phone]   Consultation  Referring Provider:     No ref. provider found Primary Care Physician:  Patient, No Pcp Per Primary Gastroenterologist:  Dr. Sherri Sear         Reason for Consultation:     Severe symptomatic anemia  Date of Admission:  03/02/2018 Date of Consultation:  03/03/2018         HPI:   Douglas Patton is a 32 y.o. male with history of NSAID intake, presents with near-syncope of symptoms in the setting of GI bleed. He reports long-standing history of rectal bleeding since age of 67. He reports intermittent episodes of painless bright red bleeding per rectum, with or without bowel movement. He would have a sensation of having a bowel movement, but when he goes, he sees fresh blood per rectum associated with some gas only. He denies constipation, straining. He reports having 2-3 formed bowel movements daily. His stools are brown in color. He reports that he never saw his stools turned black or dark maroon. He always thought that this is secondary to hemorrhoids, and uses topical creams rom over-the-counter as needed. He denies having had any evaluation in the past. He denies abdominal pain, nausea, vomiting. He lost about 20-30 pounds in last 6 months. He denies night sweats, fever, chills. He recently presented to [facility] ER on 02/23/2018 secondary to flulike symptoms. He did not have blood tests done at that time. He reports being admitted to hospital in 01/2017 in [place] secondary to food poisoning and reports that he had NG tube placement for decompression of his stomach. Yesterday, he was found to have hemoglobin of 5.7, MCV 67.7, platelets 467 in the ER. He has received 2 units of PRBCs. He is started on pantoprazole drip. His BUN and creatinine are normal.  GI is consulted for further management. He is currently  nothing by mouth and receiving third unit of PRBCs.  NSAIDs: Ibuprofen occasionally for headaches, BC powder about a month ago for headache  Antiplts/Anticoagulants/Anti thrombotics: None  GI Procedures: None He denies family history of inflammatory bowel disease or malignancy He denied having any GI surgeries He denies smoking or alcohol or IV drug abuse He works in [employer], previously used to work in [employer] for 2 years   Past Medical History:  Diagnosis Date  . Bronchitis   . Tobacco abuse     Past Surgical History:  Procedure Laterality Date  . NO PAST SURGERIES      Prior to Admission medications   Medication Sig Start Date End Date Taking? Authorizing Provider  guaiFENesin-codeine 100-10 MG/5ML syrup Take 5 mLs by mouth every 4 (four) hours as needed. Patient not taking: Reported on 03/02/2018 02/23/18   Johnn Hai, PA-C  ibuprofen (ADVIL,MOTRIN) 200 MG tablet Take 200 mg by mouth every 6 (six) hours as needed. For pain      [provider]    No family history on file.   Social History   Tobacco Use  . Smoking status: Current Every Day Smoker  . Smokeless tobacco: Never Used  Substance Use Topics  . Alcohol use: Yes    Comment: social  . Drug use: No    Allergies as of 03/02/2018  . (No Known Allergies)    Review of Systems:    All systems reviewed and negative  except where noted in HPI.   Physical Exam:  Vital signs in last 24 hours: Temp:  [98.1 F (36.7 C)-98.7 F (37.1 C)] 98.4 F (36.9 C) (03/07 1020) Pulse Rate:  [64-105] 70 (03/07 1020) Resp:  [14-22] 18 (03/07 1020) BP: (102-138)/(49-75) 110/49 (03/07 1020) SpO2:  [100 %] 100 % (03/07 1020) Weight:  [194 lb (88 kg)] 194 lb (88 kg) (03/06 2125) Last BM Date: 03/02/18 General:   Pleasant, cooperative in NAD Head:  Normocephalic and atraumatic. Eyes:   No icterus.   Conjunctiva pink. PERRLA. Ears:  Normal auditory acuity. Neck:  Supple; no masses or  thyroidomegaly Lungs: Respirations even and unlabored. Lungs clear to auscultation bilaterally.   No wheezes, crackles, or rhonchi.  Heart:  Regular rate and rhythm;  Without murmur, clicks, rubs or gallops Abdomen:  Soft, nondistended, nontender. Normal bowel sounds. No appreciable masses or hepatomegaly.  No rebound or guarding.  Rectal:  Not performed. Msk:  Symmetrical without gross deformities.  Strength normal Extremities:  Without edema, cyanosis or clubbing. Neurologic:  Alert and oriented x3;  grossly normal neurologically. Skin:  Intact without significant lesions or rashes. Cervical Nodes:  No significant cervical adenopathy. Psych:  Alert and cooperative. Normal affect.  LAB RESULTS: CBC Latest Ref Rng & Units 03/03/2018 03/02/2018 11/07/2011  WBC 3.8 - 10.6 K/uL 6.4 8.2 -  Hemoglobin 13.0 - 18.0 g/dL 6.8(L) 5.7(L) 16.0  Hematocrit 40.0 - 52.0 % 22.2(L) 19.2(L) 47.0  Platelets 150 - 440 K/uL 386 467(H) -    BMET BMP Latest Ref Rng & Units 03/03/2018 03/02/2018 11/07/2011  Glucose 65 - 99 mg/dL 113(H) 95 126(H)  BUN 6 - 20 mg/dL _0 Creatinine 0.61 - 1.24 mg/dL 1.01 1.17 1.10  Sodium 135 - 145 mmol/L 137 140 140  Potassium 3.5 - 5.1 mmol/L 3.9 4.2 4.5  Chloride 101 - 111 mmol/L 106 104 103  CO2 22 - 32 mmol/L 22 24 -  Calcium 8.9 - 10.3 mg/dL 8.4(L) 9.0 -    LFT Hepatic Function Latest Ref Rng & Units 03/03/2018  Total Protein 6.5 - 8.1 g/dL 6.6  Albumin 3.5 - 5.0 g/dL 3.5  AST 15 - 41 U/L 23  ALT 17 - 63 U/L 25  Alk Phosphatase 38 - 126 U/L 57  Total Bilirubin 0.3 - 1.2 mg/dL 0.5  Bilirubin, Direct 0.1 - 0.5 mg/dL <0.1(L)     STUDIES: No results found.    Impression / Plan:   BRUIN BOLGER is a 32 y.o. African-American male with no significant past medical history presents with presyncope in the setting of severe iron deficiency anemia and chronic history of intermittent, painless rectal bleeding, unintentional weight loss. He does not have evidence of  chronic liver disease/cirrhosis  Differentials include inflammatory bowel disease or malignancy or Meckel's diverticulum or diverticular or very less likely brisk upper GI bleed  Iron deficiency anemia: Ferritin 2. Normal B12 and folate levels - Change Protonix to twice a day - Ordered ferriheme x 1, we will need oral iron as outpatient - Monitor CBC closely, goal hemoglobin above 7 - Okay with full liquid diet - Change to clear liquid diet tonight - Nothing by mouth past midnight - Bowel prep ordered - EGD and colonoscopy tomorrow  Thank you for involving me in the care of this patient.      LOS: 1 day   Sherri Sear, MD  03/03/2018, 1:13 PM   Note: This dictation was prepared with Dragon dictation along with smaller  Company secretary. Any transcriptional errors that result from this process are unintentional.

## 2018-03-03 NOTE — Progress Notes (Signed)
MD Posey Pronto was notified of hbg 6.8. Order was to transfuse 1 unit and recheck H & H post 2 hour transfusion. No further orders at this time.

## 2018-03-04 ENCOUNTER — Telehealth: Payer: Self-pay | Admitting: Gastroenterology

## 2018-03-04 ENCOUNTER — Encounter
Admission: EM | Disposition: A | Discharge status: Home / Self Care | Payer: Self-pay | Source: Home / Self Care | Attending: Internal Medicine

## 2018-03-04 ENCOUNTER — Inpatient Hospital Stay: Payer: Self-pay | Admitting: Certified Registered Nurse Anesthetist

## 2018-03-04 ENCOUNTER — Encounter: Payer: Self-pay | Admitting: *Deleted

## 2018-03-04 DIAGNOSIS — K921 Melena: Principal | ICD-10-CM

## 2018-03-04 DIAGNOSIS — D124 Benign neoplasm of descending colon: Secondary | ICD-10-CM

## 2018-03-04 DIAGNOSIS — K5730 Diverticulosis of large intestine without perforation or abscess without bleeding: Secondary | ICD-10-CM

## 2018-03-04 DIAGNOSIS — D62 Acute posthemorrhagic anemia: Secondary | ICD-10-CM

## 2018-03-04 DIAGNOSIS — K642 Third degree hemorrhoids: Secondary | ICD-10-CM

## 2018-03-04 HISTORY — PX: COLONOSCOPY: SHX5424

## 2018-03-04 HISTORY — PX: ESOPHAGOGASTRODUODENOSCOPY: SHX5428

## 2018-03-04 LAB — TYPE AND SCREEN
ABO/RH(D): O POS
Antibody Screen: NEGATIVE
UNIT DIVISION: 0
Unit division: 0
Unit division: 0

## 2018-03-04 LAB — BPAM RBC
BLOOD PRODUCT EXPIRATION DATE: 201903252359
Blood Product Expiration Date: 201903252359
Blood Product Expiration Date: 201903272359
ISSUE DATE / TIME: 201903071001
ISSUE DATE / TIME: 201903070105
ISSUE DATE / TIME: 201903070348
UNIT TYPE AND RH: 5100
Unit Type and Rh: 5100
Unit Type and Rh: 5100

## 2018-03-04 LAB — HEMOGLOBIN
HEMOGLOBIN: 8.4 g/dL — AB (ref 13.0–18.0)
Hemoglobin: 7.9 g/dL — ABNORMAL LOW (ref 13.0–18.0)

## 2018-03-04 LAB — HEPATITIS PANEL, ACUTE
HEP A IGM: NEGATIVE
HEP B S AG: NEGATIVE
Hep B C IgM: NEGATIVE

## 2018-03-04 LAB — HIV ANTIBODY (ROUTINE TESTING W REFLEX): HIV Screen 4th Generation wRfx: NONREACTIVE

## 2018-03-04 SURGERY — EGD (ESOPHAGOGASTRODUODENOSCOPY)
Anesthesia: General

## 2018-03-04 MED ORDER — POLYSACCHARIDE IRON COMPLEX 150 MG PO CAPS: 150.0000 mg | ORAL_CAPSULE | Freq: Every day | ORAL | 1 refills | Status: DC

## 2018-03-04 MED ORDER — FENTANYL CITRATE (PF) 100 MCG/2ML IJ SOLN
INTRAMUSCULAR | Status: DC | PRN
  Administered 2018-03-04: 100 ug via INTRAVENOUS

## 2018-03-04 MED ORDER — PROPOFOL 500 MG/50ML IV EMUL
INTRAVENOUS | Status: DC | PRN
  Administered 2018-03-04: 140 ug/kg/min via INTRAVENOUS

## 2018-03-04 MED ORDER — PROPOFOL 10 MG/ML IV BOLUS
INTRAVENOUS | Status: AC
  Filled 2018-03-04: qty 20

## 2018-03-04 MED ORDER — GLYCOPYRROLATE 0.2 MG/ML IJ SOLN
INTRAMUSCULAR | Status: DC | PRN
  Administered 2018-03-04: 0.2 mg via INTRAVENOUS

## 2018-03-04 MED ORDER — SODIUM CHLORIDE 0.9 % IV SOLN
INTRAVENOUS | Status: DC
  Administered 2018-03-04: 13:00:00 via INTRAVENOUS

## 2018-03-04 MED ORDER — POLYSACCHARIDE IRON COMPLEX 150 MG PO CAPS
150.0000 mg | ORAL_CAPSULE | Freq: Every day | ORAL | Status: DC
  Filled 2018-03-04: qty 1

## 2018-03-04 MED ORDER — DOCUSATE SODIUM 100 MG PO CAPS: 100.0000 mg | ORAL_CAPSULE | Freq: Two times a day (BID) | ORAL | 0 refills | Status: DC

## 2018-03-04 MED ORDER — DOCUSATE SODIUM 100 MG PO CAPS: 100.0000 mg | ORAL_CAPSULE | Freq: Two times a day (BID) | ORAL | Status: DC

## 2018-03-04 MED ORDER — PROPOFOL 10 MG/ML IV BOLUS
INTRAVENOUS | Status: DC | PRN
  Administered 2018-03-04 (×2): 50 mg via INTRAVENOUS

## 2018-03-04 MED ORDER — FENTANYL CITRATE (PF) 100 MCG/2ML IJ SOLN
INTRAMUSCULAR | Status: AC
  Filled 2018-03-04: qty 2

## 2018-03-04 MED ORDER — LIDOCAINE HCL (CARDIAC) 20 MG/ML IV SOLN
INTRAVENOUS | Status: DC | PRN
  Administered 2018-03-04: 100 mg via INTRAVENOUS

## 2018-03-04 MED ORDER — CYANOCOBALAMIN 1000 MCG/ML IJ SOLN
1000.0000 ug | Freq: Once | INTRAMUSCULAR | Status: DC
  Filled 2018-03-04: qty 1

## 2018-03-04 NOTE — Progress Notes (Signed)
Patient ID: Douglas Patton, male   DOB: 08-Jan-1986, 32 y.o.   MRN: [id number] EDG and colonsocpy results noted. Ok to [employer] from GI standpoint and f/u out pt with GI and cancer center for anemia

## 2018-03-04 NOTE — Telephone Encounter (Signed)
Left message with pt to schedule apt in banding clinic for large internal hemmorhoid  Per Dr. Marius Ditch

## 2018-03-04 NOTE — Op Note (Signed)
[facility] Patient Name: Douglas Patton Procedure Date: 03/04/2018 12:58 PM MRN: [id number] Account #: [id number] Date of Birth: [birth year] Admit Type: Outpatient Age: 32 [facility]OM 2 Gender: Male Note Status: Finalized Procedure:            Upper GI endoscopy Indications:          Acute post hemorrhagic anemia, Hematochezia Providers:            Lin Landsman MD, MD Medicines:            Monitored Anesthesia Care Complications:        No immediate complications. Estimated blood loss: None. Procedure:            Pre-Anesthesia Assessment:                       - Prior to the procedure, a History and Physical was                        performed, and patient medications and allergies were                        reviewed. The patient is competent. The risks and                        benefits of the procedure and the sedation options and                        risks were discussed with the patient. All questions                        were answered and informed consent was obtained.                        Patient identification and proposed procedure were                        verified by the physician, the nurse, the                        anesthesiologist, the anesthetist and the technician in                        the pre-procedure area in the procedure room in the                        endoscopy suite. Mental Status Examination: alert and                        oriented. Airway Examination: normal oropharyngeal                        airway and neck mobility. Respiratory Examination:                        clear to auscultation. CV Examination: normal.                        Prophylactic Antibiotics: The patient does not require  prophylactic antibiotics. Prior Anticoagulants: The                        patient has taken no previous anticoagulant or                        antiplatelet agents. ASA Grade  Assessment: II - A                        patient with mild systemic disease. After reviewing the                        risks and benefits, the patient was deemed in                        satisfactory condition to undergo the procedure. The                        anesthesia plan was to use monitored anesthesia care                        (MAC). Immediately prior to administration of                        medications, the patient was re-assessed for adequacy                        to receive sedatives. The heart rate, respiratory rate,                        oxygen saturations, blood pressure, adequacy of                        pulmonary ventilation, and response to care were                        monitored throughout the procedure. The physical status                        of the patient was re-assessed after the procedure.                       After obtaining informed consent, the endoscope was                        passed under direct vision. Throughout the procedure,                        the patient's blood pressure, pulse, and oxygen                        saturations were monitored continuously. The Endoscope                        was introduced through the mouth, and advanced to the                        second part of duodenum. The upper GI endoscopy was  accomplished without difficulty. The patient tolerated                        the procedure well. Findings:      The duodenal bulb and second portion of the duodenum were normal.      The entire examined stomach was normal.      The cardia and gastric fundus were normal on retroflexion.      The gastroesophageal junction and examined esophagus were normal. Impression:           - Normal duodenal bulb and second portion of the                        duodenum.                       - Normal stomach.                       - Normal gastroesophageal junction and esophagus.                       - No  specimens collected. Recommendation:       - No upper GI siurce of bleeding identified                       - Proceed with colonoscopy as scheduled                       - See colonoscopy report Procedure Code(s):    --- Professional ---                       [phone], Esophagogastroduodenoscopy, flexible, transoral;                        diagnostic, including collection of specimen(s) by                        brushing or washing, when performed (separate procedure) Diagnosis Code(s):    --- Professional ---                       D62, Acute posthemorrhagic anemia                       K92.1, Melena (includes Hematochezia) CPT copyright 2016 American Medical Association. All rights reserved. The codes documented in this report are preliminary and upon coder review may  be revised to meet current compliance requirements. Dr. Ulyess Mort Lin Landsman MD, MD 03/04/2018 1:58:46 PM This report has been signed electronically. Number of Addenda: 0 Note Initiated On: 03/04/2018 12:58 PM      [facility]

## 2018-03-04 NOTE — Anesthesia Post-op Follow-up Note (Signed)
Anesthesia QCDR form completed.        

## 2018-03-04 NOTE — Progress Notes (Signed)
EGD normal Colonoscopy showed liquid brown stool in entire colon and TI Large internal hemorrhoids with stigmata of recent bleeding which is the source of rectal bleeding  Recs: Regular diet Avoid NSAIDs Refer to cancer center for IV iron as out pt Start oral iron 325mg  BID, and take miralax to avoid iron induced constipation Follow up with in 1week with me for out pt hemorrhoid ligation  Cephas Darby, MD [address]  [place], [place] [address]  Main: [phone]  Fax: [phone] Pager: [phone]

## 2018-03-04 NOTE — Progress Notes (Signed)
[facility] [place] NAME: Douglas Patton    MR#:  [id number]  DATE OF BIRTH:  12-Aug-1986  SUBJECTIVE:   No vomiting nausea abdominal pain or rectal bleed.  Patient is status post 3 unit blood transfusion.  He took some GoLYTELY for colonoscopy prep. REVIEW OF SYSTEMS:   Review of Systems  Constitutional: Negative for chills, fever and weight loss.  HENT: Negative for ear discharge, ear pain and nosebleeds.   Eyes: Negative for blurred vision, pain and discharge.  Respiratory: Negative for sputum production, shortness of breath, wheezing and stridor.   Cardiovascular: Negative for chest pain, palpitations, orthopnea and PND.  Gastrointestinal: Positive for blood in stool and melena. Negative for abdominal pain, diarrhea, nausea and vomiting.  Genitourinary: Negative for frequency and urgency.  Musculoskeletal: Negative for back pain and joint pain.  Neurological: Negative for sensory change, speech change, focal weakness and weakness.  Psychiatric/Behavioral: Negative for depression and hallucinations. The patient is not nervous/anxious.    Tolerating Diet: npo Tolerating PY:PPJKDTOIZT DRUG ALLERGIES:  No Known Allergies  VITALS:  Blood pressure (!) 106/51, pulse (!) 57, temperature 98.2 F (36.8 C), temperature source Oral, resp. rate 19, height 5\' 11"  (1.803 m), weight 88 kg (194 lb), SpO2 100 %.  PHYSICAL EXAMINATION:   Physical Exam  GENERAL:  32 y.o.-year-old patient lying in the bed with no acute distress. Pallor + EYES: Pupils equal, round, reactive to light and accommodation. No scleral icterus. Extraocular muscles intact.  HEENT: Head atraumatic, normocephalic. Oropharynx and nasopharynx clear.  NECK:  Supple, no jugular venous distention. No thyroid enlargement, no tenderness.  LUNGS: Normal breath sounds bilaterally, no wheezing, rales, rhonchi. No use of accessory muscles of respiration.  CARDIOVASCULAR: S1, S2  normal. No murmurs, rubs, or gallops.  ABDOMEN: Soft, nontender, nondistended. Bowel sounds present. No organomegaly or mass.  EXTREMITIES: No cyanosis, clubbing or edema b/l.    NEUROLOGIC: Cranial nerves II through XII are intact. No focal Motor or sensory deficits b/l.   PSYCHIATRIC:  patient is alert and oriented x 3.  SKIN: No obvious rash, lesion, or ulcer.   LABORATORY PANEL:  CBC Recent Labs  Lab 03/03/18 0845 03/03/18 1546  03/04/18 0645  WBC 6.4  --   --   --   HGB 6.8* 8.1*   < > 7.9*  HCT 22.2* 25.4*  --   --   PLT 386  --   --   --    < > = values in this interval not displayed.    Chemistries  Recent Labs  Lab 03/03/18 0110 03/03/18 0845  NA 137  --   K 3.9  --   CL 106  --   CO2 22  --   GLUCOSE 113*  --   BUN 14  --   CREATININE 1.01  --   CALCIUM 8.4*  --   AST  --  23  ALT  --  25  ALKPHOS  --  57  BILITOT  --  0.5   Cardiac Enzymes Recent Labs  Lab 03/02/18 2127  TROPONINI <0.03   RADIOLOGY:  No results found. ASSESSMENT AND PLAN:   Douglas Patton  is a 32 y.o. male who presents with symptomatic anemia.  Patient has a long-standing history of rectal bleeding.  He says that he was evaluated for this before in the emergency department one time when he had a significant bleeding episode.  He states that he was told he  had hemorrhoids  1.  Near syncope secondary to symptomatic anemia -She came in with hemoglobin of 5.7--- 2 unit blood transfusion--6.8--1 unit BT--8.2--7.9 -Continue IV Protonix drip -GI consultation appreciated patient is scheduled for EGD and colonoscopy. -Takes NSAIDs on and off for headache  2.  History of hemorrhoids -On and off rectal bleeding.  No luminal evaluation done in the past -We will give stool softeners to avoid constipation -Await luminal evaluation  3.  Tobacco abuse Recommended smoking cessation   Case discussed with Care Management/Social Worker. Management plans discussed with the patient, family  and they are in agreement.  CODE STATUS: Full  DVT Prophylaxis: SCD  TOTAL TIME TAKING CARE OF THIS PATIENT: *30* minutes.  >50% time spent on counselling and coordination of care  POSSIBLE D/C IN 1-2 DAYS, DEPENDING ON CLINICAL CONDITION.  Note: This dictation was prepared with Dragon dictation along with smaller phrase technology. Any transcriptional errors that result from this process are unintentional.  Fritzi Mandes M.D on 03/04/2018 at 7:52 AM  Between 7am to 6pm - Pager - [phone]  After 6pm go to [email or web address] - password [email or web address] [facility]  Office  [phone]  CC: Primary care physician; Patient, No Pcp PerPatient ID: Douglas Patton, male   DOB: [birth year], 32 y.o.   MRN: [id number]

## 2018-03-04 NOTE — Progress Notes (Signed)
Administered enema. Pt had very bloody results with some liquid stool. 1 large clot also passed.

## 2018-03-04 NOTE — Transfer of Care (Signed)
Immediate Anesthesia Transfer of Care Note  Patient: Douglas Patton  Procedure(s) Performed: ESOPHAGOGASTRODUODENOSCOPY (EGD) (N/A ) COLONOSCOPY (N/A )  Patient Location: PACU  Anesthesia Type:General  Level of Consciousness: sedated  Airway & Oxygen Therapy: Patient Spontanous Breathing and Patient connected to nasal cannula oxygen  Post-op Assessment: Report given, VSS  Post vital signs: Reviewed and stable  Last Vitals:  Vitals:   03/04/18 1252 03/04/18 1300  BP: 128/66 (!) (P) 104/51  Pulse: (!) 55 (!) (P) 58  Resp: 18 (P) 16  Temp: (!) 36.4 C (!) (P) 36.2 C  SpO2: 100% (P) 100%    Last Pain:  Vitals:   03/04/18 1300  TempSrc: (P) Tympanic  PainSc:          Complications: No apparent anesthesia complications

## 2018-03-04 NOTE — Anesthesia Procedure Notes (Signed)
Performed by: Filomena Pokorney, CRNA Pre-anesthesia Checklist: Patient identified, Emergency Drugs available, Suction available, Patient being monitored and Timeout performed Patient Re-evaluated:Patient Re-evaluated prior to induction Oxygen Delivery Method: Nasal cannula Induction Type: IV induction       

## 2018-03-04 NOTE — Op Note (Signed)
[facility] Patient Name: Douglas Patton Procedure Date: 03/04/2018 12:57 PM MRN: [id number] Account #: [id number] Date of Birth: May 01, 1986 Admit Type: Inpatient Age: 32 Room: [facility] ENDO ROOM 2 Gender: Male Note Status: Finalized Procedure:            Colonoscopy Indications:          Rectal bleeding, Acute post hemorrhagic anemia Providers:            Lin Landsman MD, MD Medicines:            Monitored Anesthesia Care Complications:        No immediate complications. Estimated blood loss: None. Procedure:            Pre-Anesthesia Assessment:                       - Prior to the procedure, a History and Physical was                        performed, and patient medications and allergies were                        reviewed. The patient is competent. The risks and                        benefits of the procedure and the sedation options and                        risks were discussed with the patient. All questions                        were answered and informed consent was obtained.                        Patient identification and proposed procedure were                        verified by the physician, the nurse, the                        anesthesiologist, the anesthetist and the technician in                        the pre-procedure area in the procedure room in the                        endoscopy suite. Mental Status Examination: alert and                        oriented. Airway Examination: normal oropharyngeal                        airway and neck mobility. Respiratory Examination:                        clear to auscultation. CV Examination: normal.                        Prophylactic Antibiotics: The patient does not require  prophylactic antibiotics. Prior Anticoagulants: The                        patient has taken no previous anticoagulant or                        antiplatelet agents. ASA Grade  Assessment: II - A                        patient with mild systemic disease. After reviewing the                        risks and benefits, the patient was deemed in                        satisfactory condition to undergo the procedure. The                        anesthesia plan was to use monitored anesthesia care                        (MAC). Immediately prior to administration of                        medications, the patient was re-assessed for adequacy                        to receive sedatives. The heart rate, respiratory rate,                        oxygen saturations, blood pressure, adequacy of                        pulmonary ventilation, and response to care were                        monitored throughout the procedure. The physical status                        of the patient was re-assessed after the procedure.                       After obtaining informed consent, the colonoscope was                        passed under direct vision. Throughout the procedure,                        the patient's blood pressure, pulse, and oxygen                        saturations were monitored continuously. The                        Colonoscope was introduced through the anus and                        advanced to the 15 cm into the ileum. The colonoscopy  was performed without difficulty. The patient tolerated                        the procedure well. The quality of the bowel                        preparation was evaluated using the BBPS [facility] Bowel                        Preparation Scale) with scores of: Right Colon = 3,                        Transverse Colon = 3 and Left Colon = 3 (entire mucosa                        seen well with no residual staining, small fragments of                        stool or opaque liquid). The total BBPS score equals 9. Findings:      The perianal exam findings include internal hemorrhoids that prolapse       with  straining, but require manual replacement into the anal canal       (Grade III).      The terminal ileum appeared normal.      A 5 mm polyp was found in the descending colon. The polyp was sessile.       The polyp was removed with a cold snare. Resection and retrieval were       complete.      A single medium-mouthed diverticulum was found in the ascending colon.      Non-bleeding internal hemorrhoids were found during retroflexion. The       hemorrhoids were large.      Normal mucosa was found in the entire colon. Impression:           - Internal hemorrhoids that prolapse with straining,                        but require manual replacement into the anal canal                        (Grade III) found on perianal exam.                       - The examined portion of the ileum was normal, brown                        liquid stool is present.                       - One 5 mm polyp in the descending colon, removed with                        a cold snare. Resected and retrieved.                       - Diverticulosis in the ascending colon.                       - Non-bleeding internal hemorrhoids.                       -  Normal mucosa in the entire examined colon, liquid                        brown stool present. Recommendation:       - Rectal bleeding secondary to internal hemorrhoids                       - Return patient to hospital ward for possible                        discharge same day.                       - Resume regular diet today.                       - Continue present medications.                       - Return to my office in 1 week for rubberband ligation                        of internal hemorrhoids.                       - Avoid constipation                       - Anusol rectal cream BID Procedure Code(s):    --- Professional ---                       [phone], Colonoscopy, flexible; with removal of tumor(s),                        polyp(s), or other lesion(s) by  snare technique Diagnosis Code(s):    --- Professional ---                       K64.2, Third degree hemorrhoids                       D12.4, Benign neoplasm of descending colon                       K62.5, Hemorrhage of anus and rectum                       D62, Acute posthemorrhagic anemia                       K57.30, Diverticulosis of large intestine without                        perforation or abscess without bleeding CPT copyright 2016 American Medical Association. All rights reserved. The codes documented in this report are preliminary and upon coder review may  be revised to meet current compliance requirements. Dr. Ulyess Mort Lin Landsman MD, MD 03/04/2018 1:59:18 PM This report has been signed electronically. Number of Addenda: 0 Note Initiated On: 03/04/2018 12:57 PM Scope Withdrawal Time: 0 hours 12 minutes 25 seconds  Total Procedure Duration: 0 hours 16 minutes 7 seconds       [facility]

## 2018-03-04 NOTE — Plan of Care (Signed)
  Progressing Education: Knowledge of General Education information will improve 03/04/2018 1217 - Progressing by Milderd Meager, RN Health Behavior/Discharge Planning: Ability to manage health-related needs will improve 03/04/2018 1217 - Progressing by Milderd Meager, RN Clinical Measurements: Ability to maintain clinical measurements within normal limits will improve 03/04/2018 1217 - Progressing by Milderd Meager, RN Will remain free from infection 03/04/2018 1217 - Progressing by Milderd Meager, RN Diagnostic test results will improve 03/04/2018 1217 - Progressing by Milderd Meager, RN Respiratory complications will improve 03/04/2018 1217 - Progressing by Milderd Meager, RN Cardiovascular complication will be avoided 03/04/2018 1217 - Progressing by Milderd Meager, RN Activity: Risk for activity intolerance will decrease 03/04/2018 1217 - Progressing by Milderd Meager, RN Nutrition: Adequate nutrition will be maintained 03/04/2018 1217 - Progressing by Milderd Meager, RN Coping: Level of anxiety will decrease 03/04/2018 1217 - Progressing by Milderd Meager, RN Elimination: Will not experience complications related to bowel motility 03/04/2018 1217 - Progressing by Milderd Meager, RN Will not experience complications related to urinary retention 03/04/2018 1217 - Progressing by Milderd Meager, RN Pain Managment: General experience of comfort will improve 03/04/2018 1217 - Progressing by Milderd Meager, RN Safety: Ability to remain free from injury will improve 03/04/2018 1217 - Progressing by Milderd Meager, RN Skin Integrity: Risk for impaired skin integrity will decrease 03/04/2018 1217 - Progressing by Milderd Meager, RN Education: Ability to identify signs and symptoms of gastrointestinal bleeding will improve 03/04/2018 1217 - Progressing by Milderd Meager, RN Bowel/Gastric: Will show no signs and symptoms of gastrointestinal  bleeding 03/04/2018 1217 - Progressing by Milderd Meager, RN Fluid Volume: Will show no signs and symptoms of excessive bleeding 03/04/2018 1217 - Progressing by Milderd Meager, RN Clinical Measurements: Complications related to the disease process, condition or treatment will be avoided or minimized 03/04/2018 1217 - Progressing by Milderd Meager, RN

## 2018-03-04 NOTE — Anesthesia Postprocedure Evaluation (Signed)
Anesthesia Post Note  Patient: Douglas Patton  Procedure(s) Performed: ESOPHAGOGASTRODUODENOSCOPY (EGD) (N/A ) COLONOSCOPY (N/A )  Patient location during evaluation: Endoscopy Anesthesia Type: General Level of consciousness: awake and alert Pain management: pain level controlled Vital Signs Assessment: post-procedure vital signs reviewed and stable Respiratory status: spontaneous breathing, nonlabored ventilation, respiratory function stable and patient connected to nasal cannula oxygen Cardiovascular status: blood pressure returned to baseline and stable Postop Assessment: no apparent nausea or vomiting Anesthetic complications: no     Last Vitals:  Vitals:   03/04/18 1355 03/04/18 1405  BP: (!) 104/51 92/74  Pulse: (!) 55 63  Resp: 16 16  Temp: (!) 36.2 C   SpO2: 100% 100%    Last Pain:  Vitals:   03/04/18 1355  TempSrc: Tympanic  PainSc:                  Precious Haws Nachum [place]ossett

## 2018-03-04 NOTE — Discharge Summary (Signed)
[facility] [place] NAME: Douglas Patton    MR#:  [id number]  DATE OF BIRTH:  1986-09-27  DATE OF ADMISSION:  03/02/2018 ADMITTING PHYSICIAN: Lance Coon, MD  DATE OF DISCHARGE: 03/04/2018  PRIMARY CARE PHYSICIAN: Patient, No Pcp Per    ADMISSION DIAGNOSIS:  Near syncope [R55] GI bleed due to NSAIDs [K92.2, T39.395A]  DISCHARGE DIAGNOSIS:  Acute on Chronic anemia--iron deficiency Rectal bleed acute on Chronic due to long standing hemorroids  SECONDARY DIAGNOSIS:   Past Medical History:  Diagnosis Date  . Bronchitis   . Tobacco abuse     HOSPITAL COURSE:  MichaelMitchellis a31 y.o.malewho presents with symptomatic anemia. Patient has a long-standing history of rectal bleeding. He says that he was evaluated for this before in the emergency department one time when he had a significant bleeding episode. He states that he wastold he had hemorrhoids  1.  Near syncope secondary to symptomatic anemia -She came in with hemoglobin of 5.7--- 2 unit blood transfusion--6.8--1 unit BT--8.2--7.9 -GI consultation appreciated patient is scheduled for EGD was normal and colonoscopy showed liquid brown stool in entire colon and TI Large internal hemorrhoids with stigmata of recent bleeding which is the source of rectal bleeding -pt will need banding as out pt -start on oral iron, docusate and refere to cancer center for IDA w/u and IV iron infusions  2.  History of hemorrhoids -On and off rectal bleeding.  No luminal evaluation done in the past -We will give stool softeners to avoid constipation  3.  Tobacco abuse Recommended smoking cessation  Overall stable D/c home  CONSULTS OBTAINED:  Treatment Team:  Lin Landsman, MD  DRUG ALLERGIES:  No Known Allergies  DISCHARGE MEDICATIONS:   Allergies as of 03/04/2018   No Known Allergies     Medication List    TAKE these medications   docusate sodium 100 MG  capsule Commonly known as:  COLACE Take 1 capsule (100 mg total) by mouth 2 (two) times daily.   guaiFENesin-codeine 100-10 MG/5ML syrup Take 5 mLs by mouth every 4 (four) hours as needed.   ibuprofen 200 MG tablet Commonly known as:  ADVIL,MOTRIN Take 200 mg by mouth every 6 (six) hours as needed. For pain   iron polysaccharides 150 MG capsule Commonly known as:  NIFEREX Take 1 capsule (150 mg total) by mouth daily.       If you experience worsening of your admission symptoms, develop shortness of breath, life threatening emergency, suicidal or homicidal thoughts you must seek medical attention immediately by calling 911 or calling your MD immediately  if symptoms less severe.  You Must read complete instructions/literature along with all the possible adverse reactions/side effects for all the Medicines you take and that have been prescribed to you. Take any new Medicines after you have completely understood and accept all the possible adverse reactions/side effects.   Please note  You were cared for by a hospitalist during your hospital stay. If you have any questions about your discharge medications or the care you received while you were in the hospital after you are discharged, you can call the unit and asked to speak with the hospitalist on call if the hospitalist that took care of you is not available. Once you are discharged, your primary care physician will handle any further medical issues. Please note that NO REFILLS for any discharge medications will be authorized once you are discharged, as it is imperative that you return  to your primary care physician (or establish a relationship with a primary care physician if you do not have one) for your aftercare needs so that they can reassess your need for medications and monitor your lab values.  DATA REVIEW:   CBC  Recent Labs  Lab 03/03/18 0845 03/03/18 1546  03/04/18 0645  WBC 6.4  --   --   --   HGB 6.8* 8.1*   < > 7.9*   HCT 22.2* 25.4*  --   --   PLT 386  --   --   --    < > = values in this interval not displayed.    Chemistries  Recent Labs  Lab 03/03/18 0110 03/03/18 0845  NA 137  --   K 3.9  --   CL 106  --   CO2 22  --   GLUCOSE 113*  --   BUN 14  --   CREATININE 1.01  --   CALCIUM 8.4*  --   AST  --  23  ALT  --  25  ALKPHOS  --  57  BILITOT  --  0.5    Microbiology Results   No results found for this or any previous visit (from the past 240 hour(s)).  RADIOLOGY:  No results found.   Management plans discussed with the patient, family and they are in agreement.  CODE STATUS:     Code Status Orders  (From admission, onward)        Start     Ordered   03/03/18 0013  Full code  Continuous     03/03/18 0012    Code Status History    Date Active Date Inactive Code Status Order ID Comments User Context   This patient has a current code status but no historical code status.      TOTAL TIME TAKING CARE OF THIS PATIENT: *40* minutes.    Fritzi Mandes M.D on 03/04/2018 at 4:21 PM  Between 7am to 6pm - Pager - [phone] After 6pm go to [email or web address] - password [email or web address] [facility]  Office  [phone]  CC: Primary care physician; Patient, No Pcp Per

## 2018-03-04 NOTE — Anesthesia Preprocedure Evaluation (Signed)
Anesthesia Evaluation  Patient identified by MRN, date of birth, ID band Patient awake    Reviewed: Allergy & Precautions, H&P , NPO status , Patient's Chart, lab work & pertinent test results, reviewed documented beta blocker date and time   History of Anesthesia Complications Negative for: history of anesthetic complications  Airway Mallampati: I  TM Distance: >3 FB Neck ROM: full    Dental no notable dental hx. (+) Dental Advidsory Given   Pulmonary neg shortness of breath, asthma , neg sleep apnea, neg COPD, Recent URI , Resolved, Current Smoker,           Cardiovascular Exercise Tolerance: Good negative cardio ROS       Neuro/Psych negative neurological ROS  negative psych ROS   GI/Hepatic Neg liver ROS, GERD  ,  Endo/Other  negative endocrine ROS  Renal/GU negative Renal ROS  negative genitourinary   Musculoskeletal   Abdominal   Peds  Hematology  (+) Blood dyscrasia, anemia ,   Anesthesia Other Findings Past Medical History: No date: Bronchitis No date: Tobacco abuse   Reproductive/Obstetrics negative OB ROS                             Anesthesia Physical Anesthesia Plan  ASA: II  Anesthesia Plan: General   Post-op Pain Management:    Induction: Intravenous  PONV Risk Score and Plan: 1 and Propofol infusion  Airway Management Planned: Nasal Cannula  Additional Equipment:   Intra-op Plan:   Post-operative Plan:   Informed Consent: I have reviewed the patients History and Physical, chart, labs and discussed the procedure including the risks, benefits and alternatives for the proposed anesthesia with the patient or authorized representative who has indicated his/her understanding and acceptance.   Dental Advisory Given  Plan Discussed with: Anesthesiologist, CRNA and Surgeon  Anesthesia Plan Comments:         Anesthesia Quick Evaluation

## 2018-03-07 ENCOUNTER — Encounter: Payer: Self-pay | Admitting: Gastroenterology

## 2018-03-08 LAB — SURGICAL PATHOLOGY

## 2018-03-22 ENCOUNTER — Encounter: Payer: Self-pay | Admitting: Gastroenterology

## 2018-03-22 ENCOUNTER — Ambulatory Visit: Payer: Self-pay | Admitting: Gastroenterology

## 2018-08-06 ENCOUNTER — Encounter: Payer: Self-pay | Admitting: Emergency Medicine

## 2018-08-06 ENCOUNTER — Emergency Department
Admission: EM | Admit: 2018-08-06 | Discharge: 2018-08-06 | Disposition: A | Discharge status: Home / Self Care | Payer: Self-pay | Attending: Student in an Organized Health Care Education/Training Program | Admitting: Student in an Organized Health Care Education/Training Program

## 2018-08-06 ENCOUNTER — Emergency Department: Payer: Self-pay

## 2018-08-06 ENCOUNTER — Other Ambulatory Visit: Payer: Self-pay

## 2018-08-06 DIAGNOSIS — R109 Unspecified abdominal pain: Secondary | ICD-10-CM | POA: Insufficient documentation

## 2018-08-06 DIAGNOSIS — R079 Chest pain, unspecified: Secondary | ICD-10-CM | POA: Insufficient documentation

## 2018-08-06 DIAGNOSIS — R1013 Epigastric pain: Secondary | ICD-10-CM | POA: Insufficient documentation

## 2018-08-06 LAB — BASIC METABOLIC PANEL
Anion gap: 7 (ref 5–15)
BUN: 11 mg/dL (ref 6–20)
CHLORIDE: 106 mmol/L (ref 98–111)
CO2: 27 mmol/L (ref 22–32)
Calcium: 8.8 mg/dL — ABNORMAL LOW (ref 8.9–10.3)
Creatinine, Ser: 1.12 mg/dL (ref 0.61–1.24)
Glucose, Bld: 89 mg/dL (ref 70–99)
Potassium: 4.2 mmol/L (ref 3.5–5.1)
SODIUM: 140 mmol/L (ref 135–145)

## 2018-08-06 LAB — CBC
HCT: 27.1 % — ABNORMAL LOW (ref 40.0–52.0)
Hemoglobin: 8.3 g/dL — ABNORMAL LOW (ref 13.0–18.0)
MCH: 20.3 pg — AB (ref 26.0–34.0)
MCHC: 30.6 g/dL — ABNORMAL LOW (ref 32.0–36.0)
MCV: 66.4 fL — ABNORMAL LOW (ref 80.0–100.0)
PLATELETS: 227 10*3/uL (ref 150–440)
RBC: 4.08 MIL/uL — ABNORMAL LOW (ref 4.40–5.90)
RDW: 17.2 % — AB (ref 11.5–14.5)
WBC: 5.4 10*3/uL (ref 3.8–10.6)

## 2018-08-06 LAB — TROPONIN I: Troponin I: <0.03 ng/mL (ref <0.03)

## 2018-08-06 LAB — URINALYSIS, COMPLETE (UACMP) WITH MICROSCOPIC
Bacteria, UA: NONE SEEN
Bilirubin Urine: NEGATIVE
GLUCOSE, UA: NEGATIVE mg/dL
Hgb urine dipstick: NEGATIVE
Ketones, ur: NEGATIVE mg/dL
Nitrite: NEGATIVE
PH: 6 (ref 5.0–8.0)
Protein, ur: NEGATIVE mg/dL
SPECIFIC GRAVITY, URINE: 1.018 (ref 1.005–1.030)

## 2018-08-06 LAB — HEPATIC FUNCTION PANEL
ALBUMIN: 4.1 g/dL (ref 3.5–5.0)
ALK PHOS: 67 U/L (ref 38–126)
ALT: 13 U/L (ref 0–44)
AST: 24 U/L (ref 15–41)
BILIRUBIN TOTAL: 0.7 mg/dL (ref 0.3–1.2)
Bilirubin, Direct: <0.1 mg/dL (ref 0.0–0.2)
TOTAL PROTEIN: 7.1 g/dL (ref 6.5–8.1)

## 2018-08-06 LAB — LIPASE, BLOOD: Lipase: 31 U/L (ref 11–51)

## 2018-08-06 MED ORDER — SUCRALFATE 1 G PO TABS
1.0000 g | ORAL_TABLET | Freq: Once | ORAL | Status: AC
  Administered 2018-08-06: 1 g via ORAL
  Filled 2018-08-06: qty 1

## 2018-08-06 MED ORDER — SUCRALFATE 1 G PO TABS: 1.0000 g | ORAL_TABLET | Freq: Three times a day (TID) | ORAL | 0 refills | Status: DC

## 2018-08-06 MED ORDER — GI COCKTAIL ~~LOC~~
30.0000 mL | Freq: Once | ORAL | Status: AC
  Administered 2018-08-06: 30 mL via ORAL
  Filled 2018-08-06: qty 30

## 2018-08-06 MED ORDER — IOHEXOL 300 MG/ML  SOLN
100.0000 mL | Freq: Once | INTRAMUSCULAR | Status: AC | PRN
Start: 2018-08-06 — End: 2018-08-06
  Administered 2018-08-06: 100 mL via INTRAVENOUS

## 2018-08-06 MED ORDER — MORPHINE SULFATE (PF) 4 MG/ML IV SOLN
4.0000 mg | INTRAVENOUS | Status: DC | PRN
  Administered 2018-08-06: 4 mg via INTRAVENOUS
  Filled 2018-08-06: qty 1

## 2018-08-06 MED ORDER — RANITIDINE HCL 150 MG PO TABS: 150.0000 mg | ORAL_TABLET | Freq: Two times a day (BID) | ORAL | 1 refills | Status: DC

## 2018-08-06 MED ORDER — ONDANSETRON HCL 4 MG/2ML IJ SOLN
4.0000 mg | Freq: Once | INTRAMUSCULAR | Status: AC
  Administered 2018-08-06: 4 mg via INTRAVENOUS
  Filled 2018-08-06: qty 2

## 2018-08-06 MED ORDER — PROCHLORPERAZINE MALEATE 10 MG PO TABS: 10.0000 mg | ORAL_TABLET | Freq: Four times a day (QID) | ORAL | 0 refills | Status: DC | PRN

## 2018-08-06 NOTE — ED Provider Notes (Signed)
[facility] Provider Note    First MD Initiated Contact with Patient 08/06/18 2057     (approximate)  I have reviewed the triage vital signs and the nursing notes.   HISTORY  Chief Complaint Weakness and Chest Pain    HPI Douglas Patton is a 32 y.o. male with chief complaint of chest pain left arm pain and lower abdominal pain.  Patient was recently admitted to the hospital for syncope after a GI bleed and found to have a hemoglobin 5 requiring transfusions.  Had endoscopy colonoscopy at that time which did show internal hemorrhoids.  Patient does have a history of smoking and does use ibuprofen.  Presents the ER again because he was feeling weak and with midsternal nonradiating chest pain.  No pain shooting through to his back.  Has been taking iron supplementation.  States the pain is in his right lower quadrant does still have his appendix.    Past Medical History:  Diagnosis Date  . Bronchitis   . Tobacco abuse    History reviewed. No pertinent family history. Past Surgical History:  Procedure Laterality Date  . COLONOSCOPY N/A 03/04/2018   Procedure: COLONOSCOPY;  Surgeon: Lin Landsman, MD;  Location: [facility] ENDOSCOPY;  Service: Gastroenterology;  Laterality: N/A;  . ESOPHAGOGASTRODUODENOSCOPY N/A 03/04/2018   Procedure: ESOPHAGOGASTRODUODENOSCOPY (EGD);  Surgeon: Lin Landsman, MD;  Location: [facility] ENDOSCOPY;  Service: Gastroenterology;  Laterality: N/A;  . NO PAST SURGERIES     Patient Active Problem List   Diagnosis Date Noted  . GI bleed 03/02/2018  . Symptomatic anemia 03/02/2018  . TOBACCO DEPENDENCE 02/24/2007  . RHINITIS, ALLERGIC 02/24/2007  . ACNE 02/24/2007      Prior to Admission medications   Medication Sig Start Date End Date Taking? Authorizing Provider  docusate sodium (COLACE) 100 MG capsule Take 1 capsule (100 mg total) by mouth 2 (two) times daily. 03/04/18   Fritzi Mandes, MD    guaiFENesin-codeine 100-10 MG/5ML syrup Take 5 mLs by mouth every 4 (four) hours as needed. Patient not taking: Reported on 03/02/2018 02/23/18   Johnn Hai, PA-C  ibuprofen (ADVIL,MOTRIN) 200 MG tablet Take 200 mg by mouth every 6 (six) hours as needed. For pain      [provider]  iron polysaccharides (NIFEREX) 150 MG capsule Take 1 capsule (150 mg total) by mouth daily. 03/04/18   Fritzi Mandes, MD  prochlorperazine (COMPAZINE) 10 MG tablet Take 1 tablet (10 mg total) by mouth every 6 (six) hours as needed for nausea or vomiting. 08/06/18   Merlyn Lot, MD  ranitidine (ZANTAC) 150 MG tablet Take 1 tablet (150 mg total) by mouth 2 (two) times daily. 08/06/18 08/06/19  Merlyn Lot, MD  sucralfate (CARAFATE) 1 g tablet Take 1 tablet (1 g total) by mouth 4 (four) times daily -  with meals and at bedtime. 08/06/18   Merlyn Lot, MD    Allergies Patient has no known allergies.    Social History Social History   Tobacco Use  . Smoking status: Current Every Day Smoker  . Smokeless tobacco: Never Used  Substance Use Topics  . Alcohol use: Yes    Comment: social  . Drug use: No    Review of Systems Patient denies headaches, rhinorrhea, blurry vision, numbness, shortness of breath, chest pain, edema, cough, abdominal pain, nausea, vomiting, diarrhea, dysuria, fevers, rashes or hallucinations unless otherwise stated above in HPI. ____________________________________________   PHYSICAL EXAM:  VITAL SIGNS: Vitals:  08/06/18 2213 08/06/18 2300  BP: 122/77 138/82  Pulse: 78 84  Resp: 15 18  Temp:    SpO2: 96% 100%    Constitutional: Alert and oriented.  Eyes: Conjunctivae are normal.  Head: Atraumatic. Nose: No congestion/rhinnorhea. Mouth/Throat: Mucous membranes are moist.   Neck: No stridor. Painless ROM.  Cardiovascular: Normal rate, regular rhythm. Grossly normal heart sounds.  Good peripheral circulation. Respiratory: Normal respiratory effort.   No retractions. Lungs CTAB. Gastrointestinal: Soft with mild ttp. No distention. No abdominal bruits. No CVA tenderness. Genitourinary: deferred Musculoskeletal: No lower extremity tenderness nor edema.  No joint effusions. Neurologic:  Normal speech and language. No gross focal neurologic deficits are appreciated. No facial droop Skin:  Skin is warm, dry and intact. No rash noted. Psychiatric: Mood and affect are normal. Speech and behavior are normal.  ____________________________________________   LABS (all labs ordered are listed, but only abnormal results are displayed)  Results for orders placed or performed during the hospital encounter of 08/06/18 (from the past 24 hour(s))  Basic metabolic panel     Status: Abnormal   Collection Time: 08/06/18  4:21 PM  Result Value Ref Range   Sodium 140 135 - 145 mmol/L   Potassium 4.2 3.5 - 5.1 mmol/L   Chloride 106 98 - 111 mmol/L   CO2 27 22 - 32 mmol/L   Glucose, Bld 89 70 - 99 mg/dL   BUN 11 6 - 20 mg/dL   Creatinine, Ser 1.12 0.61 - 1.24 mg/dL   Calcium 8.8 (L) 8.9 - 10.3 mg/dL   GFR calc non Af Amer >60 >60 mL/min   GFR calc Af Amer >60 >60 mL/min   Anion gap 7 5 - 15  CBC     Status: Abnormal   Collection Time: 08/06/18  4:21 PM  Result Value Ref Range   WBC 5.4 3.8 - 10.6 K/uL   RBC 4.08 (L) 4.40 - 5.90 MIL/uL   Hemoglobin 8.3 (L) 13.0 - 18.0 g/dL   HCT 27.1 (L) 40.0 - 52.0 %   MCV 66.4 (L) 80.0 - 100.0 fL   MCH 20.3 (L) 26.0 - 34.0 pg   MCHC 30.6 (L) 32.0 - 36.0 g/dL   RDW 17.2 (H) 11.5 - 14.5 %   Platelets 227 150 - 440 K/uL  Troponin I     Status: None   Collection Time: 08/06/18  4:21 PM  Result Value Ref Range   Troponin I <0.03 <0.03 ng/mL  Urinalysis, Complete w Microscopic     Status: Abnormal   Collection Time: 08/06/18  8:58 PM  Result Value Ref Range   Color, Urine YELLOW (A) YELLOW   APPearance CLEAR (A) CLEAR   Specific Gravity, Urine 1.018 1.005 - 1.030   pH 6.0 5.0 - 8.0   Glucose, UA NEGATIVE  NEGATIVE mg/dL   Hgb urine dipstick NEGATIVE NEGATIVE   Bilirubin Urine NEGATIVE NEGATIVE   Ketones, ur NEGATIVE NEGATIVE mg/dL   Protein, ur NEGATIVE NEGATIVE mg/dL   Nitrite NEGATIVE NEGATIVE   Leukocytes, UA SMALL (A) NEGATIVE   RBC / HPF 6-10 0 - 5 RBC/hpf   WBC, UA 11-20 0 - 5 WBC/hpf   Bacteria, UA NONE SEEN NONE SEEN   Squamous Epithelial / LPF 0-5 0 - 5   Mucus PRESENT   Troponin I     Status: None   Collection Time: 08/06/18  8:58 PM  Result Value Ref Range   Troponin I <0.03 <0.03 ng/mL  Hepatic function panel  Status: None   Collection Time: 08/06/18  8:58 PM  Result Value Ref Range   Total Protein 7.1 6.5 - 8.1 g/dL   Albumin 4.1 3.5 - 5.0 g/dL   AST 24 15 - 41 U/L   ALT 13 0 - 44 U/L   Alkaline Phosphatase 67 38 - 126 U/L   Total Bilirubin 0.7 0.3 - 1.2 mg/dL   Bilirubin, Direct <0.1 0.0 - 0.2 mg/dL   Indirect Bilirubin NOT CALCULATED 0.3 - 0.9 mg/dL  Lipase, blood     Status: None   Collection Time: 08/06/18  8:58 PM  Result Value Ref Range   Lipase 31 11 - 51 U/L   ____________________________________________  EKG My review and personal interpretation at Time: 16:21   Indication: chest pain  Rate: 80  Rhythm: sinus Axis: normal Other: normal intervals, no stemi ____________________________________________  RADIOLOGY  I personally reviewed all radiographic images ordered to evaluate for the above acute complaints and reviewed radiology reports and findings.  These findings were personally discussed with the patient.  Please see medical record for radiology report.  ____________________________________________   PROCEDURES  Procedure(s) performed:  Procedures    Critical Care performed: no ____________________________________________   INITIAL IMPRESSION / ASSESSMENT AND PLAN / ED COURSE  Pertinent labs & imaging results that were available during my care of the patient were reviewed by me and considered in my medical decision making (see  chart for details).   DDX: appendicitis, colitis, ibd, acs, pna, gastritis, pancreatitis  Douglas Patton is a 32 y.o. who presents to the ED with as described above.  Patient is AFVSS in ED. Exam as above. Given current presentation have considered the above differential.  Blood work is reassuring.  Does have stable anemia.  CT imaging ordered for abdominal pain to rule out appendicitis or enteritis or SBO.  No evidence of colitis or IBD.  EKG shows no evidence of acute ischemia and 2 sets of enzymes are negative.  Patient does have history of heartburn.  Patient given GI cocktail with some improvement in his symptoms.  Start on Carafate as well as antiacid medication.  Symptoms may be worsening by recent iron supplementation.  Patient encouraged to follow-up with GI as an outpatient and to return to the ER if he develops any worsening signs or symptoms.  Have discussed with the patient and available family all diagnostics and treatments performed thus far and all questions were answered to the best of my ability. The patient demonstrates understanding and agreement with plan.       As part of my medical decision making, I reviewed the following data within the [facility] notes reviewed and incorporated, Labs reviewed, notes from prior ED visits.   ____________________________________________   FINAL CLINICAL IMPRESSION(S) / ED DIAGNOSES  Final diagnoses:  Chest pain, unspecified type  Epigastric pain      NEW MEDICATIONS STARTED DURING THIS VISIT:  New Prescriptions   PROCHLORPERAZINE (COMPAZINE) 10 MG TABLET    Take 1 tablet (10 mg total) by mouth every 6 (six) hours as needed for nausea or vomiting.   RANITIDINE (ZANTAC) 150 MG TABLET    Take 1 tablet (150 mg total) by mouth 2 (two) times daily.   SUCRALFATE (CARAFATE) 1 G TABLET    Take 1 tablet (1 g total) by mouth 4 (four) times daily -  with meals and at bedtime.     Note:  This document was  prepared using Systems analyst  and may include unintentional dictation errors.    Merlyn Lot, MD 08/06/18 2320

## 2018-08-06 NOTE — ED Notes (Signed)
Per MD take GI cocktail then PO challenge then dc if pt passes

## 2018-08-06 NOTE — ED Notes (Signed)
Pt to and from CT. ABCs intact. NAD. 

## 2018-08-06 NOTE — ED Notes (Signed)
Pt was given water and applesauce for a PO challenge. Pt kept food and drink down with no issues

## 2018-08-06 NOTE — ED Notes (Signed)
Pt connected to cardiac monitor.

## 2018-08-06 NOTE — Discharge Instructions (Signed)

## 2018-08-06 NOTE — ED Triage Notes (Addendum)
Pt arrived via POV dropped off by friend with reports of generalized weakness. Pt states sxs started this morning while at work and have progressively worsened.  Pt states he has hx of anemia with blood transfusion. Pt also reports some chest pain when coughing and right side rib pain.

## 2018-10-28 ENCOUNTER — Other Ambulatory Visit: Payer: Self-pay

## 2018-10-28 ENCOUNTER — Encounter: Payer: Self-pay | Admitting: Emergency Medicine

## 2018-10-28 ENCOUNTER — Emergency Department
Admission: EM | Admit: 2018-10-28 | Discharge: 2018-10-28 | Disposition: A | Discharge status: Home / Self Care | Payer: Self-pay | Attending: Emergency Medicine | Admitting: Emergency Medicine

## 2018-10-28 DIAGNOSIS — F17200 Nicotine dependence, unspecified, uncomplicated: Secondary | ICD-10-CM | POA: Insufficient documentation

## 2018-10-28 DIAGNOSIS — R69 Illness, unspecified: Secondary | ICD-10-CM

## 2018-10-28 DIAGNOSIS — Z79899 Other long term (current) drug therapy: Secondary | ICD-10-CM | POA: Insufficient documentation

## 2018-10-28 DIAGNOSIS — J111 Influenza due to unidentified influenza virus with other respiratory manifestations: Secondary | ICD-10-CM | POA: Insufficient documentation

## 2018-10-28 LAB — INFLUENZA PANEL BY PCR (TYPE A & B)
INFLAPCR: NEGATIVE
Influenza B By PCR: NEGATIVE

## 2018-10-28 MED ORDER — FEXOFENADINE-PSEUDOEPHED ER 60-120 MG PO TB12: 1.0000 | ORAL_TABLET | Freq: Two times a day (BID) | ORAL | 0 refills | Status: DC

## 2018-10-28 MED ORDER — BENZONATATE 100 MG PO CAPS: 200.0000 mg | ORAL_CAPSULE | Freq: Three times a day (TID) | ORAL | 0 refills | Status: DC | PRN

## 2018-10-28 MED ORDER — IBUPROFEN 600 MG PO TABS: 600.0000 mg | ORAL_TABLET | Freq: Three times a day (TID) | ORAL | 0 refills | Status: DC | PRN

## 2018-10-28 MED ORDER — BENZONATATE 100 MG PO CAPS
200.0000 mg | ORAL_CAPSULE | Freq: Once | ORAL | Status: AC
  Administered 2018-10-28: 200 mg via ORAL
  Filled 2018-10-28: qty 2

## 2018-10-28 MED ORDER — KETOROLAC TROMETHAMINE 60 MG/2ML IM SOLN
60.0000 mg | Freq: Once | INTRAMUSCULAR | Status: AC
  Administered 2018-10-28: 60 mg via INTRAMUSCULAR
  Filled 2018-10-28: qty 2

## 2018-10-28 NOTE — ED Triage Notes (Signed)
Pt arrives with concerns over possible flu like symptoms. Pt states the symptoms started 3 days prior and pt has had inability to received relief from OTC medications. Pt states he has generalized body aches, runny nose, dry cough.

## 2018-10-28 NOTE — ED Notes (Signed)
Pt verbalized understanding of discharge instructions. NAD at this time. 

## 2018-10-28 NOTE — ED Provider Notes (Signed)
[facility] Emergency Department Provider Note   ____________________________________________   First MD Initiated Contact with Patient 10/28/18 1307     (approximate)  I have reviewed the triage vital signs and the nursing notes.   HISTORY  Chief Complaint Generalized Body Aches    HPI Douglas Patton is a 32 y.o. male patient presents with 3 days of body aches, nasal congestion, sore throat, nonproductive cough, and nausea.  Patient denies vomiting or diarrhea.  Patient state no relief with over-the-counter cough preparations.  Patient rates his pain as a 6/10.  Patient described the pain is "aching".  Past Medical History:  Diagnosis Date  . Bronchitis   . Tobacco abuse     Patient Active Problem List   Diagnosis Date Noted  . GI bleed 03/02/2018  . Symptomatic anemia 03/02/2018  . TOBACCO DEPENDENCE 02/24/2007  . RHINITIS, ALLERGIC 02/24/2007  . ACNE 02/24/2007    Past Surgical History:  Procedure Laterality Date  . COLONOSCOPY N/A 03/04/2018   Procedure: COLONOSCOPY;  Surgeon: Lin Landsman, MD;  Location: [facility] ENDOSCOPY;  Service: Gastroenterology;  Laterality: N/A;  . ESOPHAGOGASTRODUODENOSCOPY N/A 03/04/2018   Procedure: ESOPHAGOGASTRODUODENOSCOPY (EGD);  Surgeon: Lin Landsman, MD;  Location: [facility] ENDOSCOPY;  Service: Gastroenterology;  Laterality: N/A;  . NO PAST SURGERIES      Prior to Admission medications   Medication Sig Start Date End Date Taking? Authorizing Provider  benzonatate (TESSALON PERLES) 100 MG capsule Take 2 capsules (200 mg total) by mouth 3 (three) times daily as needed. 10/28/18 10/28/19  Sable Feil, PA-C  docusate sodium (COLACE) 100 MG capsule Take 1 capsule (100 mg total) by mouth 2 (two) times daily. 03/04/18   Fritzi Mandes, MD  fexofenadine-pseudoephedrine (ALLEGRA-D) 60-120 MG 12 hr tablet Take 1 tablet by mouth 2 (two) times daily. 10/28/18   Sable Feil, PA-C  guaiFENesin-codeine 100-10  MG/5ML syrup Take 5 mLs by mouth every 4 (four) hours as needed. Patient not taking: Reported on 03/02/2018 02/23/18   Johnn Hai, PA-C  ibuprofen (ADVIL,MOTRIN) 200 MG tablet Take 200 mg by mouth every 6 (six) hours as needed. For pain      [provider]  ibuprofen (ADVIL,MOTRIN) 600 MG tablet Take 1 tablet (600 mg total) by mouth every 8 (eight) hours as needed. 10/28/18   Sable Feil, PA-C  iron polysaccharides (NIFEREX) 150 MG capsule Take 1 capsule (150 mg total) by mouth daily. 03/04/18   Fritzi Mandes, MD  prochlorperazine (COMPAZINE) 10 MG tablet Take 1 tablet (10 mg total) by mouth every 6 (six) hours as needed for nausea or vomiting. 08/06/18   Merlyn Lot, MD  ranitidine (ZANTAC) 150 MG tablet Take 1 tablet (150 mg total) by mouth 2 (two) times daily. 08/06/18 08/06/19  Merlyn Lot, MD  sucralfate (CARAFATE) 1 g tablet Take 1 tablet (1 g total) by mouth 4 (four) times daily -  with meals and at bedtime. 08/06/18   Merlyn Lot, MD    Allergies Patient has no known allergies.  No family history on file.  Social History Social History   Tobacco Use  . Smoking status: Current Every Day Smoker  . Smokeless tobacco: Never Used  Substance Use Topics  . Alcohol use: Yes    Comment: social  . Drug use: No    Review of Systems Constitutional: No fever/chills.  Myalgia Eyes: No visual changes. ENT: No sore throat. Cardiovascular: Denies chest pain. Respiratory: Denies shortness of breath. Gastrointestinal: No abdominal  pain.  Nausea without vomiting.  No diarrhea.  No constipation. Genitourinary: Negative for dysuria. Musculoskeletal: Positive for back pain. Skin: Negative for rash. Neurological: Negative for headaches, focal weakness or numbness.   ____________________________________________   PHYSICAL EXAM:  VITAL SIGNS: ED Triage Vitals  Enc Vitals Group     BP 10/28/18 1301 (!) 159/67     Pulse Rate 10/28/18 1301 95     Resp 10/28/18  1301 17     Temp 10/28/18 1301 99 F (37.2 C)     Temp Source 10/28/18 1301 Oral     SpO2 10/28/18 1301 100 %     Weight 10/28/18 1302 200 lb (90.7 kg)     Height 10/28/18 1302 5\' 11"  (1.803 m)     Head Circumference --      Peak Flow --      Pain Score 10/28/18 1302 6     Pain Loc --      Pain Edu? --      Excl. in [place]? --    Constitutional: Alert and oriented. Well appearing and in no acute distress. Eyes: Conjunctivae are normal. PERRL. EOMI. Head: Atraumatic. Nose: Edematous nasal turbinates clear rhinorrhea.. Mouth/Throat: Mucous membranes are moist.  Oropharynx non-erythematous.  Postnasal drainage. Neck: No stridor.  Hematological/Lymphatic/Immunilogical: No cervical lymphadenopathy. Cardiovascular: Normal rate, regular rhythm. Grossly normal heart sounds.  Good peripheral circulation.  Elevated blood pressure Respiratory: Normal respiratory effort.  No retractions. Lungs CTAB. Gastrointestinal: Soft and nontender. No distention. No abdominal bruits. No CVA tenderness. Musculoskeletal: No lower extremity tenderness nor edema.  No joint effusions. Neurologic:  Normal speech and language. No gross focal neurologic deficits are appreciated. No gait instability. Skin:  Skin is warm, dry and intact. No rash noted. Psychiatric: Mood and affect are normal. Speech and behavior are normal.  ____________________________________________   LABS (all labs ordered are listed, but only abnormal results are displayed)  Labs Reviewed  INFLUENZA PANEL BY PCR (TYPE A & B)   ____________________________________________  EKG   ____________________________________________  RADIOLOGY  ED MD interpretation:    Official radiology report(s): No results found.  ____________________________________________   PROCEDURES  Procedure(s) performed: None  Procedures  Critical Care performed: No  ____________________________________________   INITIAL IMPRESSION / ASSESSMENT AND  PLAN / ED COURSE  As part of my medical decision making, I reviewed the following data within the [place] with cough, congestion, and body aches consistent with viral illness.  Discussed negative flu results with patient.  Patient given discharge care instruction work note.  Patient advised take medication as directed.  Patient [facility].      ____________________________________________   FINAL CLINICAL IMPRESSION(S) / ED DIAGNOSES  Final diagnoses:  Influenza-like illness     ED Discharge Orders         Ordered    benzonatate (TESSALON PERLES) 100 MG capsule  3 times daily PRN     10/28/18 1448    fexofenadine-pseudoephedrine (ALLEGRA-D) 60-120 MG 12 hr tablet  2 times daily     10/28/18 1448    ibuprofen (ADVIL,MOTRIN) 600 MG tablet  Every 8 hours PRN     10/28/18 1448           Note:  This document was prepared using Dragon voice recognition software and may include unintentional dictation errors.    Sable Feil, PA-C 10/28/18 1451    Schaevitz, Randall An, MD 10/28/18 1452

## 2018-11-01 ENCOUNTER — Emergency Department
Admission: EM | Admit: 2018-11-01 | Discharge: 2018-11-01 | Disposition: A | Discharge status: Home / Self Care | Payer: Self-pay | Attending: Student in an Organized Health Care Education/Training Program | Admitting: Student in an Organized Health Care Education/Training Program

## 2018-11-01 ENCOUNTER — Encounter: Payer: Self-pay | Admitting: Emergency Medicine

## 2018-11-01 ENCOUNTER — Emergency Department: Payer: Self-pay

## 2018-11-01 ENCOUNTER — Other Ambulatory Visit: Payer: Self-pay

## 2018-11-01 DIAGNOSIS — M5442 Lumbago with sciatica, left side: Secondary | ICD-10-CM

## 2018-11-01 DIAGNOSIS — M549 Dorsalgia, unspecified: Secondary | ICD-10-CM | POA: Insufficient documentation

## 2018-11-01 DIAGNOSIS — Z79899 Other long term (current) drug therapy: Secondary | ICD-10-CM | POA: Insufficient documentation

## 2018-11-01 DIAGNOSIS — R1084 Generalized abdominal pain: Secondary | ICD-10-CM | POA: Insufficient documentation

## 2018-11-01 DIAGNOSIS — R200 Anesthesia of skin: Secondary | ICD-10-CM | POA: Insufficient documentation

## 2018-11-01 DIAGNOSIS — M5441 Lumbago with sciatica, right side: Secondary | ICD-10-CM

## 2018-11-01 DIAGNOSIS — D649 Anemia, unspecified: Secondary | ICD-10-CM | POA: Insufficient documentation

## 2018-11-01 DIAGNOSIS — D509 Iron deficiency anemia, unspecified: Secondary | ICD-10-CM

## 2018-11-01 DIAGNOSIS — R05 Cough: Secondary | ICD-10-CM | POA: Insufficient documentation

## 2018-11-01 DIAGNOSIS — F17210 Nicotine dependence, cigarettes, uncomplicated: Secondary | ICD-10-CM | POA: Insufficient documentation

## 2018-11-01 LAB — COMPREHENSIVE METABOLIC PANEL
ALBUMIN: 4.2 g/dL (ref 3.5–5.0)
ALT: 18 U/L (ref 0–44)
AST: 26 U/L (ref 15–41)
Alkaline Phosphatase: 64 U/L (ref 38–126)
Anion gap: 8 (ref 5–15)
BUN: 12 mg/dL (ref 6–20)
CHLORIDE: 108 mmol/L (ref 98–111)
CO2: 24 mmol/L (ref 22–32)
Calcium: 9.2 mg/dL (ref 8.9–10.3)
Creatinine, Ser: 1.11 mg/dL (ref 0.61–1.24)
GFR calc Af Amer: >60 mL/min (ref >60)
GFR calc non Af Amer: >60 mL/min (ref >60)
GLUCOSE: 94 mg/dL (ref 70–99)
POTASSIUM: 4.1 mmol/L (ref 3.5–5.1)
SODIUM: 140 mmol/L (ref 135–145)
Total Bilirubin: 0.4 mg/dL (ref 0.3–1.2)
Total Protein: 7.4 g/dL (ref 6.5–8.1)

## 2018-11-01 LAB — CBC
HCT: 24.1 % — ABNORMAL LOW (ref 39.0–52.0)
HEMOGLOBIN: 6.5 g/dL — AB (ref 13.0–17.0)
MCH: 17 pg — ABNORMAL LOW (ref 26.0–34.0)
MCHC: 27 g/dL — ABNORMAL LOW (ref 30.0–36.0)
MCV: 62.9 fL — ABNORMAL LOW (ref 80.0–100.0)
Platelets: 256 10*3/uL (ref 150–400)
RBC: 3.83 MIL/uL — AB (ref 4.22–5.81)
RDW: 18.5 % — ABNORMAL HIGH (ref 11.5–15.5)
WBC: 4 10*3/uL (ref 4.0–10.5)
nRBC: 0 % (ref 0.0–0.2)

## 2018-11-01 LAB — IRON AND TIBC
IRON: 8 ug/dL — AB (ref 45–182)
SATURATION RATIOS: 2 % — AB (ref 17.9–39.5)
TIBC: 433 ug/dL (ref 250–450)
UIBC: 425 ug/dL

## 2018-11-01 LAB — URINALYSIS, COMPLETE (UACMP) WITH MICROSCOPIC
BILIRUBIN URINE: NEGATIVE
Bacteria, UA: NONE SEEN
GLUCOSE, UA: NEGATIVE mg/dL
Hgb urine dipstick: NEGATIVE
KETONES UR: NEGATIVE mg/dL
Leukocytes, UA: NEGATIVE
Nitrite: NEGATIVE
PROTEIN: NEGATIVE mg/dL
Specific Gravity, Urine: 1.024 (ref 1.005–1.030)
pH: 6 (ref 5.0–8.0)

## 2018-11-01 LAB — PROTIME-INR
INR: 1.02
Prothrombin Time: 13.3 seconds (ref 11.4–15.2)

## 2018-11-01 LAB — FERRITIN: FERRITIN: 2 ng/mL — AB (ref 24–336)

## 2018-11-01 LAB — HEMOGLOBIN AND HEMATOCRIT, BLOOD
HEMATOCRIT: 26.8 % — AB (ref 39.0–52.0)
Hemoglobin: 7.5 g/dL — ABNORMAL LOW (ref 13.0–17.0)

## 2018-11-01 MED ORDER — SODIUM CHLORIDE 0.9 % IV SOLN: 10.0000 mL/h | Freq: Once | INTRAVENOUS | Status: DC

## 2018-11-01 MED ORDER — IRON 325 (65 FE) MG PO TABS: 1.0000 | ORAL_TABLET | ORAL | 2 refills | Status: DC

## 2018-11-01 MED ORDER — MORPHINE SULFATE (PF) 4 MG/ML IV SOLN
4.0000 mg | Freq: Once | INTRAVENOUS | Status: AC
  Administered 2018-11-01: 4 mg via INTRAVENOUS
  Filled 2018-11-01: qty 1

## 2018-11-01 MED ORDER — KETOROLAC TROMETHAMINE 30 MG/ML IJ SOLN
15.0000 mg | Freq: Once | INTRAMUSCULAR | Status: AC
  Administered 2018-11-01: 15 mg via INTRAVENOUS
  Filled 2018-11-01: qty 1

## 2018-11-01 MED ORDER — HYDROCODONE-ACETAMINOPHEN 5-325 MG PO TABS: 1.0000 | ORAL_TABLET | ORAL | 0 refills | Status: DC | PRN

## 2018-11-01 MED ORDER — CYCLOBENZAPRINE HCL 5 MG PO TABS: 5.0000 mg | ORAL_TABLET | Freq: Three times a day (TID) | ORAL | 0 refills | Status: DC | PRN

## 2018-11-01 MED ORDER — NAPROXEN 500 MG PO TABS: 500.0000 mg | ORAL_TABLET | Freq: Two times a day (BID) | ORAL | 2 refills | Status: DC

## 2018-11-01 MED ORDER — IOHEXOL 300 MG/ML  SOLN
100.0000 mL | Freq: Once | INTRAMUSCULAR | Status: AC | PRN
  Administered 2018-11-01: 100 mL via INTRAVENOUS

## 2018-11-01 MED ORDER — IOPAMIDOL (ISOVUE-300) INJECTION 61%
30.0000 mL | Freq: Once | INTRAVENOUS | Status: DC | PRN
  Filled 2018-11-01: qty 30

## 2018-11-01 MED ORDER — BUPIVACAINE HCL (PF) 0.5 % IJ SOLN
INTRAMUSCULAR | Status: AC
  Administered 2018-11-01: 22:00:00
  Filled 2018-11-01: qty 30

## 2018-11-01 MED ORDER — PREDNISONE 10 MG PO TABS: 10.0000 mg | ORAL_TABLET | Freq: Every day | ORAL | 0 refills | Status: DC

## 2018-11-01 NOTE — Discharge Instructions (Addendum)

## 2018-11-01 NOTE — ED Provider Notes (Addendum)
[facility] Emergency Department Provider Note  ____________________________________________  Time seen: Approximately 2:00 PM  I have reviewed the triage vital signs and the nursing notes.   HISTORY  Chief Complaint Back Pain    HPI Douglas Patton is a 32 y.o. male that presents to the emergency department for evaluation of right mid back pain today. Pain starts in his back and radiates into the right buttocks.  He feels numbness to the back of his thigh.  Pain started when he was sitting in the chair coughing. He was diagnosed with a viral URI last week and has been coughing since.  He has not noticed any blood or dark-colored stools.  No bowel or bladder dysfunction.  No fever, chills, nausea, vomiting, abdominal pain, dysuria, urgency, frequency, hematuria, weakness.   Past Medical History:  Diagnosis Date  . Bronchitis   . Tobacco abuse     Patient Active Problem List   Diagnosis Date Noted  . GI bleed 03/02/2018  . Symptomatic anemia 03/02/2018  . TOBACCO DEPENDENCE 02/24/2007  . RHINITIS, ALLERGIC 02/24/2007  . ACNE 02/24/2007    Past Surgical History:  Procedure Laterality Date  . COLONOSCOPY N/A 03/04/2018   Procedure: COLONOSCOPY;  Surgeon: Lin Landsman, MD;  Location: [facility] ENDOSCOPY;  Service: Gastroenterology;  Laterality: N/A;  . ESOPHAGOGASTRODUODENOSCOPY N/A 03/04/2018   Procedure: ESOPHAGOGASTRODUODENOSCOPY (EGD);  Surgeon: Lin Landsman, MD;  Location: [facility] ENDOSCOPY;  Service: Gastroenterology;  Laterality: N/A;  . NO PAST SURGERIES      Prior to Admission medications   Medication Sig Start Date End Date Taking? Authorizing Provider  benzonatate (TESSALON PERLES) 100 MG capsule Take 2 capsules (200 mg total) by mouth 3 (three) times daily as needed. 10/28/18 10/28/19  Sable Feil, PA-C  docusate sodium (COLACE) 100 MG capsule Take 1 capsule (100 mg total) by mouth 2 (two) times daily. 03/04/18   Fritzi Mandes, MD   fexofenadine-pseudoephedrine (ALLEGRA-D) 60-120 MG 12 hr tablet Take 1 tablet by mouth 2 (two) times daily. 10/28/18   Sable Feil, PA-C  guaiFENesin-codeine 100-10 MG/5ML syrup Take 5 mLs by mouth every 4 (four) hours as needed. Patient not taking: Reported on 03/02/2018 02/23/18   Johnn Hai, PA-C  ibuprofen (ADVIL,MOTRIN) 200 MG tablet Take 200 mg by mouth every 6 (six) hours as needed. For pain      [provider]  ibuprofen (ADVIL,MOTRIN) 600 MG tablet Take 1 tablet (600 mg total) by mouth every 8 (eight) hours as needed. 10/28/18   Sable Feil, PA-C  iron polysaccharides (NIFEREX) 150 MG capsule Take 1 capsule (150 mg total) by mouth daily. 03/04/18   Fritzi Mandes, MD  prochlorperazine (COMPAZINE) 10 MG tablet Take 1 tablet (10 mg total) by mouth every 6 (six) hours as needed for nausea or vomiting. 08/06/18   Merlyn Lot, MD  ranitidine (ZANTAC) 150 MG tablet Take 1 tablet (150 mg total) by mouth 2 (two) times daily. 08/06/18 08/06/19  Merlyn Lot, MD  sucralfate (CARAFATE) 1 g tablet Take 1 tablet (1 g total) by mouth 4 (four) times daily -  with meals and at bedtime. 08/06/18   Merlyn Lot, MD    Allergies Patient has no known allergies.  No family history on file.  Social History Social History   Tobacco Use  . Smoking status: Current Every Day Smoker  . Smokeless tobacco: Never Used  Substance Use Topics  . Alcohol use: Yes    Comment: social  . Drug use:  No     Review of Systems  Constitutional: No fever/chills Cardiovascular: No chest pain. Respiratory: Positive for cough. No SOB. Gastrointestinal: No abdominal pain.  No nausea, no vomiting.  Musculoskeletal: Positive for back pain.  Skin: Negative for rash, abrasions, lacerations, ecchymosis. Neurological: Negative for headaches. Positive for numbess.   ____________________________________________   PHYSICAL EXAM:  VITAL SIGNS: ED Triage Vitals [11/01/18 1336]  Enc Vitals  Group     BP 131/72     Pulse Rate 79     Resp 18     Temp 98 F (36.7 C)     Temp Source Oral     SpO2 100 %     Weight 200 lb 2.8 oz (90.8 kg)     Height 5\' 11"  (1.803 m)     Head Circumference      Peak Flow      Pain Score 10     Pain Loc      Pain Edu?      Excl. in [place]?      Constitutional: Alert and oriented.  Appears uncomfortable. Eyes: Conjunctivae are normal. PERRL. EOMI. Head: Atraumatic. ENT:      Ears:      Nose: No congestion/rhinnorhea.      Mouth/Throat: Mucous membranes are moist.  Neck: No stridor.  Cardiovascular: Normal rate, regular rhythm.  Good peripheral circulation. Respiratory: Normal respiratory effort without tachypnea or retractions. Lungs CTAB. Good air entry to the bases with no decreased or absent breath sounds. Gastrointestinal: Bowel sounds 4 quadrants. Soft and nontender to palpation. No palpable masses. No distention.  Musculoskeletal: Full range of motion to all extremities. No gross deformities appreciated.  Tenderness to palpation over right mid back.  No tenderness to palpation over thoracic or lumbar spine. Strength equal in lower extremity's bilaterally. Neurologic:  Normal speech and language. No gross focal neurologic deficits are appreciated.  Skin:  Skin is warm, dry and intact. No rash noted. Psychiatric: Mood and affect are normal. Speech and behavior are normal. Patient exhibits appropriate insight and judgement.   ____________________________________________   LABS (all labs ordered are listed, but only abnormal results are displayed)  Labs Reviewed - No data to display ____________________________________________  EKG   ____________________________________________  RADIOLOGY  No results found.  ____________________________________________    PROCEDURES  Procedure(s) performed:    Procedures    Medications - No data to display   ____________________________________________   INITIAL IMPRESSION /  ASSESSMENT AND PLAN / ED COURSE  Pertinent labs & imaging results that were available during my care of the patient were reviewed by me and considered in my medical decision making (see chart for details).  Review of the [place] CSRS was performed in accordance of the [place] prior to dispensing any controlled drugs.   Patient presented to the emergency department for evaluation of right mid back pain that started while coughing for 1 day.  Patient appears very uncomfortable. He was given 8mg  morphine and rated pain as a 9/10. Given radicular symptoms of pain, most likely cause is ruptured disc from coughing. No blood on urinalysis, unlikely nephrolithiasis. CBC remarkable for hemaglobin 6.5. CT was ordered and patient was transferred to main ED for blood transfusion while waiting results. Case was discussed and report was given to Dr. Quentin Cornwall.    ____________________________________________  FINAL CLINICAL IMPRESSION(S) / ED DIAGNOSES  Final diagnoses:  None      NEW MEDICATIONS STARTED DURING THIS VISIT:  ED Discharge Orders    None  This chart was dictated using voice recognition software/Dragon. Despite best efforts to proofread, errors can occur which can change the meaning. Any change was purely unintentional.    Laban Emperor, PA-C 11/01/18 1904    Laban Emperor, PA-C 11/01/18 Percell Belt, MD 11/01/18 [phone]

## 2018-11-01 NOTE — ED Notes (Signed)
Called [facility] to come pick up pt.

## 2018-11-01 NOTE — ED Notes (Signed)
Patient transported to CT 

## 2018-11-01 NOTE — ED Triage Notes (Signed)
Patient reports having a bad cough recently. States that he was sitting in a chair coughing and his lower back locked up. Patient reports history of back problems but nothing this painful. Reports pain radiates from lower back down right leg.

## 2018-11-03 LAB — TYPE AND SCREEN
ABO/RH(D): O POS
Antibody Screen: NEGATIVE
Unit division: 0
Unit division: 0

## 2018-11-03 LAB — BPAM RBC
Blood Product Expiration Date: 201912012359
Blood Product Expiration Date: 201912032359
ISSUE DATE / TIME: 201911051818
UNIT TYPE AND RH: 5100
UNIT TYPE AND RH: 5100

## 2018-11-03 LAB — PREPARE RBC (CROSSMATCH)

## 2019-10-15 ENCOUNTER — Inpatient Hospital Stay
Admission: EM | Admit: 2019-10-15 | Discharge: 2019-10-16 | DRG: 379 | Disposition: A | Discharge status: Home / Self Care | Payer: Self-pay | Attending: Specialist | Admitting: Specialist

## 2019-10-15 ENCOUNTER — Encounter: Payer: Self-pay | Admitting: Emergency Medicine

## 2019-10-15 ENCOUNTER — Inpatient Hospital Stay: Payer: Self-pay

## 2019-10-15 ENCOUNTER — Other Ambulatory Visit: Payer: Self-pay

## 2019-10-15 DIAGNOSIS — F17200 Nicotine dependence, unspecified, uncomplicated: Secondary | ICD-10-CM | POA: Diagnosis present

## 2019-10-15 DIAGNOSIS — R1084 Generalized abdominal pain: Secondary | ICD-10-CM | POA: Diagnosis present

## 2019-10-15 DIAGNOSIS — Z20828 Contact with and (suspected) exposure to other viral communicable diseases: Secondary | ICD-10-CM | POA: Diagnosis present

## 2019-10-15 DIAGNOSIS — D649 Anemia, unspecified: Secondary | ICD-10-CM

## 2019-10-15 DIAGNOSIS — Z823 Family history of stroke: Secondary | ICD-10-CM

## 2019-10-15 DIAGNOSIS — Z833 Family history of diabetes mellitus: Secondary | ICD-10-CM

## 2019-10-15 DIAGNOSIS — D500 Iron deficiency anemia secondary to blood loss (chronic): Secondary | ICD-10-CM | POA: Diagnosis present

## 2019-10-15 DIAGNOSIS — K922 Gastrointestinal hemorrhage, unspecified: Principal | ICD-10-CM | POA: Diagnosis present

## 2019-10-15 HISTORY — DX: Anemia, unspecified: D64.9

## 2019-10-15 LAB — COMPREHENSIVE METABOLIC PANEL
ALT: 17 U/L (ref 0–44)
AST: 28 U/L (ref 15–41)
Albumin: 4.4 g/dL (ref 3.5–5.0)
Alkaline Phosphatase: 59 U/L (ref 38–126)
Anion gap: 8 (ref 5–15)
BUN: 15 mg/dL (ref 6–20)
CO2: 27 mmol/L (ref 22–32)
Calcium: 9.2 mg/dL (ref 8.9–10.3)
Chloride: 105 mmol/L (ref 98–111)
Creatinine, Ser: 1.16 mg/dL (ref 0.61–1.24)
GFR calc Af Amer: >60 mL/min (ref >60)
GFR calc non Af Amer: >60 mL/min (ref >60)
Glucose, Bld: 98 mg/dL (ref 70–99)
Potassium: 4.3 mmol/L (ref 3.5–5.1)
Sodium: 140 mmol/L (ref 135–145)
Total Bilirubin: 0.5 mg/dL (ref 0.3–1.2)
Total Protein: 7.6 g/dL (ref 6.5–8.1)

## 2019-10-15 LAB — PREPARE RBC (CROSSMATCH)

## 2019-10-15 LAB — HEMOGLOBIN AND HEMATOCRIT, BLOOD
HCT: 21.9 % — ABNORMAL LOW (ref 39.0–52.0)
Hemoglobin: 6.2 g/dL — ABNORMAL LOW (ref 13.0–17.0)

## 2019-10-15 LAB — CBC
HCT: 21.9 % — ABNORMAL LOW (ref 39.0–52.0)
Hemoglobin: 5.7 g/dL — ABNORMAL LOW (ref 13.0–17.0)
MCH: 15.7 pg — ABNORMAL LOW (ref 26.0–34.0)
MCHC: 26 g/dL — ABNORMAL LOW (ref 30.0–36.0)
MCV: 60.5 fL — ABNORMAL LOW (ref 80.0–100.0)
Platelets: 273 10*3/uL (ref 150–400)
RBC: 3.62 MIL/uL — ABNORMAL LOW (ref 4.22–5.81)
RDW: 20 % — ABNORMAL HIGH (ref 11.5–15.5)
WBC: 4 10*3/uL (ref 4.0–10.5)
nRBC: 0 % (ref 0.0–0.2)

## 2019-10-15 LAB — SARS CORONAVIRUS 2 BY RT PCR (HOSPITAL ORDER, PERFORMED IN ~~LOC~~ HOSPITAL LAB): SARS Coronavirus 2: NEGATIVE

## 2019-10-15 MED ORDER — SODIUM CHLORIDE 0.9% IV SOLUTION
Freq: Once | INTRAVENOUS | Status: AC
  Administered 2019-10-15: 22:00:00 via INTRAVENOUS

## 2019-10-15 MED ORDER — SODIUM CHLORIDE 0.9 % IV SOLN
10.0000 mL/h | Freq: Once | INTRAVENOUS | Status: AC
  Administered 2019-10-15: 10 mL/h via INTRAVENOUS

## 2019-10-15 MED ORDER — ACETAMINOPHEN 650 MG RE SUPP: 650.0000 mg | Freq: Four times a day (QID) | RECTAL | Status: DC | PRN

## 2019-10-15 MED ORDER — ONDANSETRON HCL 4 MG PO TABS: 4.0000 mg | ORAL_TABLET | Freq: Four times a day (QID) | ORAL | Status: DC | PRN

## 2019-10-15 MED ORDER — ACETAMINOPHEN 325 MG PO TABS
650.0000 mg | ORAL_TABLET | Freq: Four times a day (QID) | ORAL | Status: DC | PRN
  Administered 2019-10-15: 650 mg via ORAL
  Filled 2019-10-15: qty 2

## 2019-10-15 MED ORDER — SODIUM CHLORIDE 0.9 % IV SOLN
INTRAVENOUS | Status: DC
  Administered 2019-10-16: 02:00:00 via INTRAVENOUS

## 2019-10-15 MED ORDER — NICOTINE 21 MG/24HR TD PT24
21.0000 mg | MEDICATED_PATCH | Freq: Every day | TRANSDERMAL | Status: DC
  Filled 2019-10-15: qty 1

## 2019-10-15 MED ORDER — SODIUM CHLORIDE 0.9 % IV SOLN
8.0000 mg/h | INTRAVENOUS | Status: DC
  Administered 2019-10-15 – 2019-10-16 (×2): 8 mg/h via INTRAVENOUS
  Filled 2019-10-15 (×2): qty 80

## 2019-10-15 MED ORDER — IOHEXOL 350 MG/ML SOLN
125.0000 mL | Freq: Once | INTRAVENOUS | Status: AC | PRN
  Administered 2019-10-15: 125 mL via INTRAVENOUS

## 2019-10-15 MED ORDER — ONDANSETRON HCL 4 MG/2ML IJ SOLN: 4.0000 mg | Freq: Four times a day (QID) | INTRAMUSCULAR | Status: DC | PRN

## 2019-10-15 MED ORDER — SODIUM CHLORIDE 0.9 % IV SOLN
80.0000 mg | Freq: Once | INTRAVENOUS | Status: AC
  Administered 2019-10-15: 80 mg via INTRAVENOUS
  Filled 2019-10-15: qty 80

## 2019-10-15 MED ORDER — DIPHENHYDRAMINE HCL 25 MG PO CAPS
25.0000 mg | ORAL_CAPSULE | Freq: Once | ORAL | Status: AC
  Administered 2019-10-15: 25 mg via ORAL
  Filled 2019-10-15: qty 1

## 2019-10-15 NOTE — Progress Notes (Signed)
Dr Jannifer [place] notified temp 99.1, orders given, stated ok to transfuse blood

## 2019-10-15 NOTE — ED Notes (Signed)
Pt provided phone at this time.

## 2019-10-15 NOTE — ED Notes (Signed)
ED TO INPATIENT HANDOFF REPORT  ED Nurse Name and Phone #: Wells Guiles [phone] Name/Age/Gender Douglas Patton 33 y.o. male Room/Bed: ED18HA/ED18HA  Code Status   Code Status: Prior  Home/SNF/Other Home Patient oriented to: self, place, time and situation Is this baseline? Yes   Triage Complete: Triage complete  Chief Complaint rectal bleeding/weakness  Triage Note Pt reports for the last 2 weeks has had rectal bleeding. Pt reports this has happened in the past and he had to have a blood transfusion. Pt reports takes iron pills for the anemia but is not sure they are doing any good because the last 2 days he has felt dizzy and weak. Pt appears pale in triage.    Allergies No Known Allergies  Level of Care/Admitting Diagnosis ED Disposition    ED Disposition Condition [facility] Area: [place] [[id number]]  Level of Care: Med-Surg [16]  Covid Evaluation: Asymptomatic Screening Protocol (No Symptoms)  Diagnosis: GI bleed [id number]  Admitting Physician: Saundra Shelling [id number]  Attending Physician: Saundra Shelling [id number]  Estimated length of stay: past midnight tomorrow  Certification:: I certify this patient will need inpatient services for at least 2 midnights  PT Class (Do Not Modify): Inpatient [101]  PT Acc Code (Do Not Modify): Private [1]       B Medical/Surgery History Past Medical History:  Diagnosis Date  . Anemia   . Bronchitis   . Tobacco abuse    Past Surgical History:  Procedure Laterality Date  . COLONOSCOPY N/A 03/04/2018   Procedure: COLONOSCOPY;  Surgeon: Lin Landsman, MD;  [facility] ENDOSCOPY;  Service: Gastroenterology;  Laterality: N/A;  . ESOPHAGOGASTRODUODENOSCOPY N/A 03/04/2018   Procedure: ESOPHAGOGASTRODUODENOSCOPY (EGD);  Surgeon: Lin Landsman, MD;  Location: [facility] ENDOSCOPY;  Service: Gastroenterology;  Laterality: N/A;  . NO PAST SURGERIES       A IV  Location/Drains/Wounds Patient Lines/Drains/Airways Status   Active Line/Drains/Airways    Name:   Placement date:   Placement time:   Site:   Days:   Peripheral IV 10/15/19 Right Forearm   10/15/19    1339    Forearm   less than 1   Peripheral IV 10/15/19 Left Antecubital   10/15/19    1659    Antecubital   less than 1          Intake/Output Last 24 hours  Intake/Output Summary (Last 24 hours) at 10/15/2019 1914 Last data filed at 10/15/2019 1826 Gross per 24 hour  Intake 400 ml  Output -  Net 400 ml    Labs/Imaging Results for orders placed or performed during the hospital encounter of 10/15/19 (from the past 48 hour(s))  Comprehensive metabolic panel     Status: None   Collection Time: 10/15/19  1:32 PM  Result Value Ref Range   Sodium 140 135 - 145 mmol/L   Potassium 4.3 3.5 - 5.1 mmol/L   Chloride 105 98 - 111 mmol/L   CO2 27 22 - 32 mmol/L   Glucose, Bld 98 70 - 99 mg/dL   BUN 15 6 - 20 mg/dL   Creatinine, Ser 1.16 0.61 - 1.24 mg/dL   Calcium 9.2 8.9 - 10.3 mg/dL   Total Protein 7.6 6.5 - 8.1 g/dL   Albumin 4.4 3.5 - 5.0 g/dL   AST 28 15 - 41 U/L   ALT 17 0 - 44 U/L   Alkaline Phosphatase 59 38 - 126 U/L   Total Bilirubin 0.5 0.3 -  1.2 mg/dL   GFR calc non Af Amer >60 >60 mL/min   GFR calc Af Amer >60 >60 mL/min   Anion gap 8 5 - 15    Comment: Performed at [facility], [address] [place], [place] [address]  CBC     Status: Abnormal   Collection Time: 10/15/19  1:32 PM  Result Value Ref Range   WBC 4.0 4.0 - 10.5 K/uL   RBC 3.62 (L) 4.22 - 5.81 MIL/uL   Hemoglobin 5.7 (L) 13.0 - 17.0 g/dL    Comment: Reticulocyte Hemoglobin testing may be clinically indicated, consider ordering this additional test [id number]    HCT 21.9 (L) 39.0 - 52.0 %   MCV 60.5 (L) 80.0 - 100.0 fL   MCH 15.7 (L) 26.0 - 34.0 pg   MCHC 26.0 (L) 30.0 - 36.0 g/dL   RDW 20.0 (H) 11.5 - 15.5 %   Platelets 273 150 - 400 K/uL   nRBC 0.0 0.0 - 0.2 %    Comment: Performed  at [facility], [address] [place], [place] [address]  Type and screen [place]     Status: None (Preliminary result)   Collection Time: 10/15/19  1:32 PM  Result Value Ref Range   ABO/RH(D) O POS    Antibody Screen NEG    Sample Expiration 10/18/2019,2359    Unit Number [id number]    Blood Component Type RED CELLS,LR    Unit division 00    Status of Unit ISSUED    Transfusion Status OK TO TRANSFUSE    Crossmatch Result      Compatible Performed at [facility], [address] [place], [place] [address]   Prepare RBC     Status: None   Collection Time: 10/15/19  3:43 PM  Result Value Ref Range   Order Confirmation      ORDER PROCESSED BY BLOOD BANK Performed at [facility], [address], [place], [place] [address]   SARS Coronavirus 2 by RT PCR (hospital order, performed in [place] hospital lab) Nasopharyngeal Nasopharyngeal Swab     Status: None   Collection Time: 10/15/19  5:54 PM   Specimen: Nasopharyngeal Swab  Result Value Ref Range   SARS Coronavirus 2 NEGATIVE NEGATIVE    Comment: (NOTE) If result is NEGATIVE SARS-CoV-2 target nucleic acids are NOT DETECTED. The SARS-CoV-2 RNA is generally detectable in upper and lower  respiratory specimens during the acute phase of infection. The lowest  concentration of SARS-CoV-2 viral copies this assay can detect is 250  copies / mL. A negative result does not preclude SARS-CoV-2 infection  and should not be used as the sole basis for treatment or other  patient management decisions.  A negative result may occur with  improper specimen collection / handling, submission of specimen other  than nasopharyngeal swab, presence of viral mutation(s) within the  areas targeted by this assay, and inadequate number of viral copies  (<250 copies / mL). A negative result must be combined with clinical  observations, patient history, and epidemiological information. If  result is POSITIVE SARS-CoV-2 target nucleic acids are DETECTED. The SARS-CoV-2 RNA is generally detectable in upper and lower  respiratory specimens dur ing the acute phase of infection.  Positive  results are indicative of active infection with SARS-CoV-2.  Clinical  correlation with patient history and other diagnostic information is  necessary to determine patient infection status.  Positive results do  not rule out bacterial infection  or co-infection with other viruses. If result is PRESUMPTIVE POSTIVE SARS-CoV-2 nucleic acids MAY BE PRESENT.   A presumptive positive result was obtained on the submitted specimen  and confirmed on repeat testing.  While 2019 novel coronavirus  (SARS-CoV-2) nucleic acids may be present in the submitted sample  additional confirmatory testing may be necessary for epidemiological  and / or clinical management purposes  to differentiate between  SARS-CoV-2 and other Sarbecovirus currently known to infect humans.  If clinically indicated additional testing with an alternate test  methodology [phone]) is advised. The SARS-CoV-2 RNA is generally  detectable in upper and lower respiratory sp ecimens during the acute  phase of infection. The expected result is Negative. Fact Sheet for Patients:  StrictlyIdeas.no Fact Sheet for Healthcare Providers: BankingDealers.co.za This test is not yet approved or cleared by the [place] FDA and has been authorized for detection and/or diagnosis of SARS-CoV-2 by FDA under an Emergency Use Authorization (EUA).  This EUA will remain in effect (meaning this test can be used) for the duration of the COVID-19 declaration under Section 564(b)(1) of the Act, 21 U.S.C. section 360bbb-3(b)(1), unless the authorization is terminated or revoked sooner. Performed at [facility], [address] [place], [place] [address]    No results found.  Pending  Labs Unresulted Labs (From admission, onward)    Start     Ordered   10/15/19 1900  Hemoglobin and hematocrit, blood  Now then every 6 hours,   STAT     10/15/19 1620   Signed and Held  HIV Antibody (routine testing w rflx)  (HIV Antibody (Routine testing w reflex) panel)  Once,   R     Signed and Held   Signed and Held  HIV4GL Save Tube  (HIV Antibody (Routine testing w reflex) panel)  Once,   R     Signed and Held   Signed and Held  Basic metabolic panel  Tomorrow morning,   R     Signed and Held          Vitals/Pain Today's Vitals   10/15/19 1328 10/15/19 1640 10/15/19 1700 10/15/19 1826  BP:  (!) 135/57 125/68 128/73  Pulse:  65 68 64  Resp:  18 14 14   Temp:  98 F (36.7 C) 98.2 F (36.8 C) 98.2 F (36.8 C)  TempSrc:  Oral  Oral  SpO2:  100% 99% 99%  Weight: 93 kg     Height: 5\' 11"  (1.803 m)     PainSc: 0-No pain       Isolation Precautions No active isolations  Medications Medications  pantoprazole (PROTONIX) 80 mg in sodium chloride 0.9 % 250 mL (0.32 mg/mL) infusion (8 mg/hr Intravenous New Bag/Given 10/15/19 1715)  nicotine (NICODERM CQ - dosed in mg/24 hours) patch 21 mg (has no administration in time range)  0.9 %  sodium chloride infusion (0 mL/hr Intravenous Stopped 10/15/19 1832)  pantoprazole (PROTONIX) 80 mg in sodium chloride 0.9 % 100 mL IVPB (0 mg Intravenous Stopped 10/15/19 1752)  iohexol (OMNIPAQUE) 350 MG/ML injection 125 mL (125 mLs Intravenous Contrast Given 10/15/19 1849)    Mobility walks Low fall risk   Focused Assessments GI bleed   R Recommendations: See Admitting Provider Note  Report given to:   Additional Notes:

## 2019-10-15 NOTE — H&P (Signed)
[facility] NAME: Douglas Patton    MR#:  [id number]  DATE OF BIRTH:  10/07/1986  DATE OF ADMISSION:  10/15/2019  PRIMARY CARE PHYSICIAN: Patient, No Pcp Per   REQUESTING/REFERRING PHYSICIAN:   CHIEF COMPLAINT:   Chief Complaint  Patient presents with  . Rectal Bleeding    HISTORY OF PRESENT ILLNESS: Douglas Patton  is a 33 y.o. male with a known history of anemia, tobacco abuse presented to the emergency room for rectal bleed.  Patient noticed bright rectal bleed since yesterday.  Patient says he had had a colonoscopy a year ago.  No vomiting of blood.  Has diffuse abdominal discomfort and due for a CAT scan.  In the emergency room during the work-up hemoglobin is 5.72 needs PRBC transfusion have been ordered.  Patient will be started on Protonix drip.  Active tobacco abuser.  Patient has been using NSAIDs in the past.  PAST MEDICAL HISTORY:   Past Medical History:  Diagnosis Date  . Anemia   . Bronchitis   . Tobacco abuse     PAST SURGICAL HISTORY:  Past Surgical History:  Procedure Laterality Date  . COLONOSCOPY N/A 03/04/2018   Procedure: COLONOSCOPY;  Surgeon: Douglas Landsman, MD;  Location[facility] ENDOSCOPY;  Service: Gastroenterology;  Laterality: N/A;  . ESOPHAGOGASTRODUODENOSCOPY N/A 03/04/2018   Procedure: ESOPHAGOGASTRODUODENOSCOPY (EGD);  Surgeon: Douglas Landsman, MD;  Location: [facility] ENDOSCOPY;  Service: Gastroenterology;  Laterality: N/A;  . NO PAST SURGERIES      SOCIAL HISTORY:  Social History   Tobacco Use  . Smoking status: Current Every Day Smoker  . Smokeless tobacco: Never Used  Substance Use Topics  . Alcohol use: Yes    Comment: social    FAMILY HISTORY:  Family History  Problem Relation Age of Onset  . Diabetes Mellitus II Mother   . CVA Father     DRUG ALLERGIES: No Known Allergies  REVIEW OF SYSTEMS:   CONSTITUTIONAL: No fever,has  fatigue and weakness.  EYES: No  blurred or double vision.  EARS, NOSE, AND THROAT: No tinnitus or ear pain.  RESPIRATORY: No cough, shortness of breath, wheezing or hemoptysis.  CARDIOVASCULAR: No chest pain, orthopnea, edema.  GASTROINTESTINAL: No nausea, vomiting, diarrhea or abdominal pain.  Has rectal bleed GENITOURINARY: No dysuria, hematuria.  ENDOCRINE: No polyuria, nocturia,  HEMATOLOGY: No anemia, easy bruising or bleeding SKIN: No rash or lesion. MUSCULOSKELETAL: No joint pain or arthritis.   NEUROLOGIC: No tingling, numbness, weakness.  PSYCHIATRY: No anxiety or depression.   MEDICATIONS AT HOME:  Prior to Admission medications   Medication Sig Start Date End Date Taking? Authorizing Provider  benzonatate (TESSALON PERLES) 100 MG capsule Take 2 capsules (200 mg total) by mouth 3 (three) times daily as needed. 10/28/18 10/28/19  Sable Feil, PA-C  cyclobenzaprine (FLEXERIL) 5 MG tablet Take 1 tablet (5 mg total) by mouth 3 (three) times daily as needed for muscle spasms. 11/01/18   Merlyn Lot, MD  docusate sodium (COLACE) 100 MG capsule Take 1 capsule (100 mg total) by mouth 2 (two) times daily. 03/04/18   Fritzi Mandes, MD  Ferrous Sulfate (IRON) 325 (65 Fe) MG TABS Take 1 tablet (325 mg total) by mouth every other day. 11/01/18   Merlyn Lot, MD  fexofenadine-pseudoephedrine (ALLEGRA-D) 60-120 MG 12 hr tablet Take 1 tablet by mouth 2 (two) times daily. 10/28/18   Sable Feil, PA-C  HYDROcodone-acetaminophen (NORCO) 5-325 MG tablet Take 1 tablet  by mouth every 4 (four) hours as needed for moderate pain. 11/01/18   Merlyn Lot, MD  ibuprofen (ADVIL,MOTRIN) 200 MG tablet Take 200 mg by mouth every 6 (six) hours as needed. For pain      [provider]  ibuprofen (ADVIL,MOTRIN) 600 MG tablet Take 1 tablet (600 mg total) by mouth every 8 (eight) hours as needed. 10/28/18   Sable Feil, PA-C  iron polysaccharides (NIFEREX) 150 MG capsule Take 1 capsule (150 mg total) by mouth daily.  03/04/18   Fritzi Mandes, MD  naproxen (NAPROSYN) 500 MG tablet Take 1 tablet (500 mg total) by mouth 2 (two) times daily with a meal. 11/01/18 11/01/19  Merlyn Lot, MD  predniSONE (DELTASONE) 10 MG tablet Take 1 tablet (10 mg total) by mouth daily. Day 1-2: Take 50 mg  ( 5 pills) Day 3-4 : Take 40 mg (4pills) Day 5-6: Take 30 mg (3 pills) Day 7-8:  Take 20 mg (2 pills) Day 9:  Take 10mg  (1 pill) 11/01/18   Merlyn Lot, MD  prochlorperazine (COMPAZINE) 10 MG tablet Take 1 tablet (10 mg total) by mouth every 6 (six) hours as needed for nausea or vomiting. 08/06/18   Merlyn Lot, MD  ranitidine (ZANTAC) 150 MG tablet Take 1 tablet (150 mg total) by mouth 2 (two) times daily. 08/06/18 08/06/19  Merlyn Lot, MD  sucralfate (CARAFATE) 1 g tablet Take 1 tablet (1 g total) by mouth 4 (four) times daily -  with meals and at bedtime. 08/06/18   Merlyn Lot, MD      PHYSICAL EXAMINATION:   VITAL SIGNS: Blood pressure (!) 146/69, pulse 98, temperature 99 F (37.2 C), temperature source Oral, resp. rate 20, height 5\' 11"  (1.803 m), weight 93 kg, SpO2 100 %.  GENERAL:  33 y.o.-year-old patient lying in the bed with no acute distress.  EYES: Pupils equal, round, reactive to light and accommodation. No scleral icterus. Pallor present. Extraocular muscles intact.  HEENT: Head atraumatic, normocephalic. Oropharynx and nasopharynx clear.  NECK:  Supple, no jugular venous distention. No thyroid enlargement, no tenderness.  LUNGS: Normal breath sounds bilaterally, no wheezing, rales,rhonchi or crepitation. No use of accessory muscles of respiration.  CARDIOVASCULAR: S1, S2 normal. No murmurs, rubs, or gallops.  ABDOMEN: Soft, tenderness around the umbilicus, nondistended. Bowel sounds present. No organomegaly or mass.  EXTREMITIES: No pedal edema, cyanosis, or clubbing.  NEUROLOGIC: Cranial nerves II through XII are intact. Muscle strength 5/5 in all extremities. Sensation intact. Gait not  checked.  PSYCHIATRIC: The patient is alert and oriented x 3.  SKIN: No obvious rash, lesion, or ulcer.   LABORATORY PANEL:   CBC Recent Labs  Lab 10/15/19 1332  WBC 4.0  HGB 5.7*  HCT 21.9*  PLT 273  MCV 60.5*  MCH 15.7*  MCHC 26.0*  RDW 20.0*   ------------------------------------------------------------------------------------------------------------------  Chemistries  Recent Labs  Lab 10/15/19 1332  NA 140  K 4.3  CL 105  CO2 27  GLUCOSE 98  BUN 15  CREATININE 1.16  CALCIUM 9.2  AST 28  ALT 17  ALKPHOS 59  BILITOT 0.5   ------------------------------------------------------------------------------------------------------------------ estimated creatinine clearance is 106.6 mL/min (by C-G formula based on SCr of 1.16 mg/dL). ------------------------------------------------------------------------------------------------------------------ No results for input(s): TSH, T4TOTAL, T3FREE, THYROIDAB in the last 72 hours.  Invalid input(s): FREET3   Coagulation profile No results for input(s): INR, PROTIME in the last 168 hours. ------------------------------------------------------------------------------------------------------------------- No results for input(s): DDIMER in the last 72 hours. -------------------------------------------------------------------------------------------------------------------  Cardiac Enzymes No  results for input(s): CKMB, TROPONINI, MYOGLOBIN in the last 168 hours.  Invalid input(s): CK ------------------------------------------------------------------------------------------------------------------ Invalid input(s): POCBNP  ---------------------------------------------------------------------------------------------------------------  Urinalysis    Component Value Date/Time   COLORURINE YELLOW (A) 11/01/2018 1519   APPEARANCEUR CLEAR (A) 11/01/2018 1519   LABSPEC 1.024 11/01/2018 1519   PHURINE 6.0 11/01/2018 1519    GLUCOSEU NEGATIVE 11/01/2018 1519   HGBUR NEGATIVE 11/01/2018 1519   BILIRUBINUR NEGATIVE 11/01/2018 1519   KETONESUR NEGATIVE 11/01/2018 1519   PROTEINUR NEGATIVE 11/01/2018 1519   NITRITE NEGATIVE 11/01/2018 1519   LEUKOCYTESUR NEGATIVE 11/01/2018 1519     RADIOLOGY: No results found.  EKG: Orders placed or performed during the hospital encounter of 10/15/19  . ED EKG  . ED EKG  . EKG 12-Lead  . EKG 12-Lead    IMPRESSION AND PLAN: 33 year old male patient with a known history of anemia, tobacco abuse presented to the emergency room for rectal bleed.  Patient noticed bright rectal bleed since yesterday.  -Acute gastrointestinal bleeding Admit patient to medical floor Transfused 2 units PRBC IV IV Protonix drip Serial hemoglobin hematocrit monitoring N.p.o. Gastroenterology consult Stop NSAIDs  -Tobacco abuse Tobacco cessation counseled to the patient for 6 minutes Nicotine patch offered  -Anemia PRBC's transfusion and iron supplementation once patient starts taking orally  -DVT prophylaxis with sequential compression device to lower extremities  All the records are reviewed and case discussed with ED provider. Management plans discussed with the patient, family and they are in agreement.  CODE STATUS:Full code Code Status History    Date Active Date Inactive Code Status Order ID Comments User Context   03/03/2018 0013 03/04/2018 2054 Full Code [id number]  Lance Coon, MD Inpatient   Advance Care Planning Activity       TOTAL TIME TAKING CARE OF THIS PATIENT: 52 minutes.    Saundra Shelling M.D on 10/15/2019 at 4:21 PM  Between 7am to 6pm - Pager - [phone]  After 6pm go to [email or web address] - password [email or web address] [facility]  Office  [phone]  CC: Primary care physician; Patient, No Pcp Per

## 2019-10-15 NOTE — Progress Notes (Signed)
Advanced care plan.  Purpose of the Encounter: CODE STATUS  Parties in Attendance: Patient  Patient's Decision Capacity: Good  Subjective/Patient's story: 33 y.o. male with a known history of anemia, tobacco abuse presented to the emergency room for rectal bleed.  Patient noticed bright rectal bleed since yesterday.  Patient says he had had a colonoscopy a year ago.  No vomiting of blood.  Has diffuse abdominal discomfort and due for a CAT scan.  In the emergency room during the work-up hemoglobin is 5.72 needs PRBC transfusion have been ordered.  Patient will be started on Protonix drip.  Active tobacco abuser.  Patient has been using NSAIDs in the past.   Objective/Medical story Patient needs work-up for GI bleed Needs PRBC transfusion Needs gastroenterology consult and colonoscopy Needs tobacco cessation counseling  Goals of care determination:  Advance care directives goals of care treatment plan discussed Patient is full resuscitation   CODE STATUS: Full code   Time spent discussing advanced care planning: 16 minutes

## 2019-10-15 NOTE — ED Triage Notes (Signed)
Pt reports for the last 2 weeks has had rectal bleeding. Pt reports this has happened in the past and he had to have a blood transfusion. Pt reports takes iron pills for the anemia but is not sure they are doing any good because the last 2 days he has felt dizzy and weak. Pt appears pale in triage.

## 2019-10-15 NOTE — ED Provider Notes (Signed)
[facility] Emergency Department Provider Note  ____________________________________________  Time seen: Approximately 3:32 PM  I have reviewed the triage vital signs and the nursing notes.   HISTORY  Chief Complaint Rectal Bleeding    HPI Douglas Patton is a 33 y.o. male who presents the emergency department concern for abdominal pain, rectal bleeding.  Patient has a history of rectal bleeding and has had 3 transfusions in the last year for profound anemia.  Patient reports that he is bleeding heavily for the past 2 weeks.  Patient states that he has diffuse abdominal pain typically when he has bleeding and is experiencing same diffuse pain.  No specific point of tenderness.  Patient reports that over the past several days he has felt exceptionally tired, weak, dizzy.  Patient states when he started feeling the symptoms after having bleeding he typically requires a transfusion.  Patient denies any fevers or chills, nausea or vomiting.  Patient states that they have never identified the source of his GI bleed.         Past Medical History:  Diagnosis Date  . Anemia   . Bronchitis   . Tobacco abuse     Patient Active Problem List   Diagnosis Date Noted  . GI bleed 03/02/2018  . Symptomatic anemia 03/02/2018  . TOBACCO DEPENDENCE 02/24/2007  . RHINITIS, ALLERGIC 02/24/2007  . ACNE 02/24/2007    Past Surgical History:  Procedure Laterality Date  . COLONOSCOPY N/A 03/04/2018   Procedure: COLONOSCOPY;  Surgeon: Lin Landsman, MD;  Location: [facility] ENDOSCOPY;  Service: Gastroenterology;  Laterality: N/A;  . ESOPHAGOGASTRODUODENOSCOPY N/A 03/04/2018   Procedure: ESOPHAGOGASTRODUODENOSCOPY (EGD);  Surgeon: Lin Landsman, MD;  Location: [facility] ENDOSCOPY;  Service: Gastroenterology;  Laterality: N/A;  . NO PAST SURGERIES      Prior to Admission medications   Medication Sig Start Date End Date Taking? Authorizing Provider  benzonatate (TESSALON  PERLES) 100 MG capsule Take 2 capsules (200 mg total) by mouth 3 (three) times daily as needed. 10/28/18 10/28/19  Sable Feil, PA-C  cyclobenzaprine (FLEXERIL) 5 MG tablet Take 1 tablet (5 mg total) by mouth 3 (three) times daily as needed for muscle spasms. 11/01/18   Merlyn Lot, MD  docusate sodium (COLACE) 100 MG capsule Take 1 capsule (100 mg total) by mouth 2 (two) times daily. 03/04/18   Fritzi Mandes, MD  Ferrous Sulfate (IRON) 325 (65 Fe) MG TABS Take 1 tablet (325 mg total) by mouth every other day. 11/01/18   Merlyn Lot, MD  fexofenadine-pseudoephedrine (ALLEGRA-D) 60-120 MG 12 hr tablet Take 1 tablet by mouth 2 (two) times daily. 10/28/18   Sable Feil, PA-C  HYDROcodone-acetaminophen (NORCO) 5-325 MG tablet Take 1 tablet by mouth every 4 (four) hours as needed for moderate pain. 11/01/18   Merlyn Lot, MD  ibuprofen (ADVIL,MOTRIN) 200 MG tablet Take 200 mg by mouth every 6 (six) hours as needed. For pain      [provider]  ibuprofen (ADVIL,MOTRIN) 600 MG tablet Take 1 tablet (600 mg total) by mouth every 8 (eight) hours as needed. 10/28/18   Sable Feil, PA-C  iron polysaccharides (NIFEREX) 150 MG capsule Take 1 capsule (150 mg total) by mouth daily. 03/04/18   Fritzi Mandes, MD  naproxen (NAPROSYN) 500 MG tablet Take 1 tablet (500 mg total) by mouth 2 (two) times daily with a meal. 11/01/18 11/01/19  Merlyn Lot, MD  predniSONE (DELTASONE) 10 MG tablet Take 1 tablet (10 mg total) by mouth  daily. Day 1-2: Take 50 mg  ( 5 pills) Day 3-4 : Take 40 mg (4pills) Day 5-6: Take 30 mg (3 pills) Day 7-8:  Take 20 mg (2 pills) Day 9:  Take 10mg  (1 pill) 11/01/18   Merlyn Lot, MD  prochlorperazine (COMPAZINE) 10 MG tablet Take 1 tablet (10 mg total) by mouth every 6 (six) hours as needed for nausea or vomiting. 08/06/18   Merlyn Lot, MD  ranitidine (ZANTAC) 150 MG tablet Take 1 tablet (150 mg total) by mouth 2 (two) times daily. 08/06/18 08/06/19   Merlyn Lot, MD  sucralfate (CARAFATE) 1 g tablet Take 1 tablet (1 g total) by mouth 4 (four) times daily -  with meals and at bedtime. 08/06/18   Merlyn Lot, MD    Allergies Patient has no known allergies.  No family history on file.  Social History Social History   Tobacco Use  . Smoking status: Current Every Day Smoker  . Smokeless tobacco: Never Used  Substance Use Topics  . Alcohol use: Yes    Comment: social  . Drug use: No     Review of Systems  Constitutional: No fever/chills.  Weakness, heavy GI bleeding. Eyes: No visual changes. No discharge ENT: No upper respiratory complaints. Cardiovascular: no chest pain. Respiratory: no cough. No SOB. Gastrointestinal: Diffuse abdominal pain.  Heavy GI bleeding.  No nausea, no vomiting.  No diarrhea.  No constipation. Musculoskeletal: Negative for musculoskeletal pain. Skin: Negative for rash, abrasions, lacerations, ecchymosis. Neurological: Negative for headaches, focal weakness or numbness. 10-point ROS otherwise negative.  ____________________________________________   PHYSICAL EXAM:  VITAL SIGNS: ED Triage Vitals  Enc Vitals Group     BP 10/15/19 1327 (!) 146/69     Pulse Rate 10/15/19 1327 98     Resp 10/15/19 1327 20     Temp 10/15/19 1327 99 F (37.2 C)     Temp Source 10/15/19 1327 Oral     SpO2 10/15/19 1327 100 %     Weight 10/15/19 1328 205 lb (93 kg)     Height 10/15/19 1328 5\' 11"  (1.803 m)     Head Circumference --      Peak Flow --      Pain Score 10/15/19 1328 0     Pain Loc --      Pain Edu? --      Excl. in [place]? --      Constitutional: Alert and oriented. Well appearing and in no acute distress. Eyes: Conjunctivae are normal. PERRL. EOMI. Head: Atraumatic. ENT:      Ears:       Nose: No congestion/rhinnorhea.      Mouth/Throat: Mucous membranes are moist.  Neck: No stridor.    Cardiovascular: Normal rate, regular rhythm. Normal S1 and S2.  Good peripheral  circulation. Respiratory: Normal respiratory effort without tachypnea or retractions. Lungs CTAB. Good air entry to the bases with no decreased or absent breath sounds. Gastrointestinal: No visible external abdominal wall findings.  Bowel sounds 4 quadrants. Soft but mildly diffusely tender to palpation.  No point specific tenderness.  No tenderness to palpation of McBurney's point.  No Murphy sign.. No guarding or rigidity. No palpable masses. No distention. No CVA tenderness. Musculoskeletal: Full range of motion to all extremities. No gross deformities appreciated. Neurologic:  Normal speech and language. No gross focal neurologic deficits are appreciated.  Skin:  Skin is warm, dry and intact. No rash noted. Psychiatric: Mood and affect are normal. Speech and behavior are normal. Patient  exhibits appropriate insight and judgement.   ____________________________________________   LABS (all labs ordered are listed, but only abnormal results are displayed)  Labs Reviewed  CBC - Abnormal; Notable for the following components:      Result Value   RBC 3.62 (*)    Hemoglobin 5.7 (*)    HCT 21.9 (*)    MCV 60.5 (*)    MCH 15.7 (*)    MCHC 26.0 (*)    RDW 20.0 (*)    All other components within normal limits  SARS CORONAVIRUS 2 BY RT PCR (HOSPITAL ORDER, [place] [facility])  COMPREHENSIVE METABOLIC PANEL  POC OCCULT BLOOD, ED  TYPE AND SCREEN  PREPARE RBC (CROSSMATCH)   ____________________________________________  EKG   ____________________________________________  RADIOLOGY I personally viewed and evaluated these images as part of my medical decision making, as well as reviewing the written report by the radiologist.  No results found.  ____________________________________________    PROCEDURES  Procedure(s) performed:    Procedures    Medications  0.9 %  sodium chloride infusion (has no administration in time range)      ____________________________________________   INITIAL IMPRESSION / ASSESSMENT AND PLAN / ED COURSE  Pertinent labs & imaging results that were available during my care of the patient were reviewed by me and considered in my medical decision making (see chart for details).  Review of the [place] CSRS was performed in accordance of the [place] prior to dispensing any controlled drugs.           Patient's diagnosis is consistent with GI bleeding, anemic, if transfusion.  Patient presented to the emergency department complaining of symptomatic anemia from rectal bleeding.  Patient has a history of intermittent rectal bleeding and states that is been bleeding heavily for the past 2 weeks.  Patient states that they have never been able to identify source of hemorrhage.  Patient states he has been dizzy, weak, fatigued over the past several days.  Hemoglobin of 5.7.  At this time, patient is complaining of diffuse abdominal pain so CT will be ordered, however patient will be admitted prior to return of results.  Discussed the patient with the hospitalist who agrees to admit for transfusion and GI consult.     ____________________________________________  FINAL CLINICAL IMPRESSION(S) / ED DIAGNOSES  Final diagnoses:  Acute GI bleeding  Symptomatic anemia      NEW MEDICATIONS STARTED DURING THIS VISIT:  ED Discharge Orders    None          This chart was dictated using voice recognition software/Dragon. Despite best efforts to proofread, errors can occur which can change the meaning. Any change was purely unintentional.    Darletta Moll, PA-C 10/15/19 1603    Vanessa Fowler, MD 10/15/19 [phone]

## 2019-10-15 NOTE — ED Notes (Addendum)
Transfusion completed. Pt denies complaints or new symptoms. Pending room assignment.

## 2019-10-16 DIAGNOSIS — D649 Anemia, unspecified: Secondary | ICD-10-CM

## 2019-10-16 LAB — TYPE AND SCREEN
ABO/RH(D): O POS
Antibody Screen: NEGATIVE
Unit division: 0
Unit division: 0

## 2019-10-16 LAB — HEMOGLOBIN AND HEMATOCRIT, BLOOD
HCT: 25.8 % — ABNORMAL LOW (ref 39.0–52.0)
HCT: 26.1 % — ABNORMAL LOW (ref 39.0–52.0)
Hemoglobin: 7.4 g/dL — ABNORMAL LOW (ref 13.0–17.0)
Hemoglobin: 7.5 g/dL — ABNORMAL LOW (ref 13.0–17.0)

## 2019-10-16 LAB — BPAM RBC
Blood Product Expiration Date: 202011152359
Blood Product Expiration Date: 202011182359
ISSUE DATE / TIME: 202010181632
ISSUE DATE / TIME: 202010182200
Unit Type and Rh: 5100
Unit Type and Rh: 5100

## 2019-10-16 LAB — BASIC METABOLIC PANEL
Anion gap: 6 (ref 5–15)
BUN: 12 mg/dL (ref 6–20)
CO2: 25 mmol/L (ref 22–32)
Calcium: 8.6 mg/dL — ABNORMAL LOW (ref 8.9–10.3)
Chloride: 107 mmol/L (ref 98–111)
Creatinine, Ser: 1.12 mg/dL (ref 0.61–1.24)
GFR calc Af Amer: >60 mL/min (ref >60)
GFR calc non Af Amer: >60 mL/min (ref >60)
Glucose, Bld: 86 mg/dL (ref 70–99)
Potassium: 3.9 mmol/L (ref 3.5–5.1)
Sodium: 138 mmol/L (ref 135–145)

## 2019-10-16 LAB — HIV ANTIBODY (ROUTINE TESTING W REFLEX): HIV Screen 4th Generation wRfx: NONREACTIVE

## 2019-10-16 MED ORDER — SODIUM CHLORIDE 0.9 % IV SOLN
200.0000 mg | Freq: Once | INTRAVENOUS | Status: AC
  Administered 2019-10-16: 200 mg via INTRAVENOUS
  Filled 2019-10-16: qty 10

## 2019-10-16 NOTE — Consult Note (Signed)
Douglas Lame, MD [address] [name], [place] [address] Phone: [phone] Fax : [phone]  Consultation  Referring Provider:     Dr. Estanislado Pandy Primary Care Physician:  Patient, No Pcp Per Primary Gastroenterologist:  Dr. Marius Ditch         Reason for Consultation:     Anemia  Date of Admission:  10/15/2019 Date of Consultation:  10/16/2019         HPI:   Douglas Patton is a 33 y.o. male who was admitted with anemia.  The patient has a history of anemia with a work-up including a upper endoscopy and colonoscopy by Dr. Marius Ditch last year.  At that time only internal hemorrhoids were noted.  The patient's hemoglobin 11 months ago was 7.5 with his admission hemoglobin of 5.7 which has been repeated after transfusion and is now 7.5.  The patient denies any black stools or bloody stools.  He also denies any nausea vomiting fevers or chills.  Despite the patient telling the hospitalist that he had rectal bleeding and taking NSAIDs in the past when discussing this with the patient and his significant other in the room it appears that they deny rectal bleeding such as bright red blood per rectum or melena.  The significant other states that the patient does not even like taking pills therefore will not take any anti-inflammatory medication. The patient's iron studies done in November showed his iron saturation to be very low at 2.  His iron was also low at 8.   Past Medical History:  Diagnosis Date   Anemia    Bronchitis    Tobacco abuse     Past Surgical History:  Procedure Laterality Date   COLONOSCOPY N/A 03/04/2018   Procedure: COLONOSCOPY;  Surgeon: Lin Landsman, MD;  Location: [facility] ENDOSCOPY;  Service: Gastroenterology;  Laterality: N/A;   ESOPHAGOGASTRODUODENOSCOPY N/A 03/04/2018   Procedure: ESOPHAGOGASTRODUODENOSCOPY (EGD);  Surgeon: Lin Landsman, MD;  Location: [facility] ENDOSCOPY;  Service: Gastroenterology;  Laterality: N/A;   NO PAST SURGERIES       Prior to Admission medications   Medication Sig Start Date End Date Taking? Authorizing Provider  iron polysaccharides (NIFEREX) 150 MG capsule Take 1 capsule (150 mg total) by mouth daily. 03/04/18  Yes Fritzi Mandes, MD  prochlorperazine (COMPAZINE) 10 MG tablet Take 1 tablet (10 mg total) by mouth every 6 (six) hours as needed for nausea or vomiting. 08/06/18  Yes Merlyn Lot, MD    Family History  Problem Relation Age of Onset   Diabetes Mellitus II Mother    CVA Father      Social History   Tobacco Use   Smoking status: Current Every Day Smoker   Smokeless tobacco: Never Used  Substance Use Topics   Alcohol use: Yes    Comment: social   Drug use: No    Allergies as of 10/15/2019   (No Known Allergies)    Review of Systems:    All systems reviewed and negative except where noted in HPI.   Physical Exam:  Vital signs in last 24 hours: Temp:  [98 F (36.7 C)-99.1 F (37.3 C)] 98.2 F (36.8 C) (10/19 0432) Pulse Rate:  [54-70] 54 (10/19 0432) Resp:  [14-20] 16 (10/19 0432) BP: (95-135)/(50-85) 99/53 (10/19 0432) SpO2:  [98 %-100 %] 100 % (10/19 0432) Weight:  [82 kg] 82 kg (10/18 2021) Last BM Date: 10/14/19 General:   Pleasant, cooperative in NAD Head:  Normocephalic and atraumatic. Eyes:  No icterus.   Conjunctiva pink. PERRLA. Ears:  Normal auditory acuity. Neck:  Supple; no masses or thyroidomegaly Lungs: Respirations even and unlabored. Lungs clear to auscultation bilaterally.   No wheezes, crackles, or rhonchi.  Heart:  Regular rate and rhythm;  Without murmur, clicks, rubs or gallops Abdomen:  Soft, nondistended, nontender. Normal bowel sounds. No appreciable masses or hepatomegaly.  No rebound or guarding.  Rectal:  Not performed. Msk:  Symmetrical without gross deformities.    Extremities:  Without edema, cyanosis or clubbing. Neurologic:  Alert and oriented x3;  grossly normal neurologically. Skin:  Intact without significant lesions or  rashes. Cervical Nodes:  No significant cervical adenopathy. Psych:  Alert and cooperative. Normal affect.  LAB RESULTS: Recent Labs    10/15/19 1332 10/15/19 1925 10/16/19 0443 10/16/19 1257  WBC 4.0  --   --   --   HGB 5.7* 6.2* 7.4* 7.5*  HCT 21.9* 21.9* 25.8* 26.1*  PLT 273  --   --   --    BMET Recent Labs    10/15/19 1332 10/16/19 0443  NA 140 138  K 4.3 3.9  CL 105 107  CO2 27 25  GLUCOSE 98 86  BUN 15 12  CREATININE 1.16 1.12  CALCIUM 9.2 8.6*   LFT Recent Labs    10/15/19 1332  PROT 7.6  ALBUMIN 4.4  AST 28  ALT 17  ALKPHOS 59  BILITOT 0.5   PT/INR No results for input(s): LABPROT, INR in the last 72 hours.  STUDIES: Ct Angio Abd/pel W/ And/or W/o  Result Date: 10/15/2019 CLINICAL DATA:  32 year old male with GI bleed. EXAM: CTA ABDOMEN AND PELVIS WITHOUT AND WITH CONTRAST TECHNIQUE: Multidetector CT imaging of the abdomen and pelvis was performed using the standard protocol during bolus administration of intravenous contrast. Multiplanar reconstructed images and MIPs were obtained and reviewed to evaluate the vascular anatomy. CONTRAST:  135mL OMNIPAQUE IOHEXOL 350 MG/ML SOLN COMPARISON:  CT abdomen pelvis dated 08/07/2018. FINDINGS: VASCULAR Aorta: Normal caliber aorta without aneurysm, dissection, vasculitis or significant stenosis. Celiac: Patent without evidence of aneurysm, dissection, vasculitis or significant stenosis. SMA: Patent without evidence of aneurysm, dissection, vasculitis or significant stenosis. Renals: Both renal arteries are patent without evidence of aneurysm, dissection, vasculitis, fibromuscular dysplasia or significant stenosis. Duplicated left renal artery. IMA: Patent without evidence of aneurysm, dissection, vasculitis or significant stenosis. Inflow: Patent without evidence of aneurysm, dissection, vasculitis or significant stenosis. Proximal Outflow: Bilateral common femoral and visualized portions of the superficial and profunda  femoral arteries are patent without evidence of aneurysm, dissection, vasculitis or significant stenosis. Veins: No obvious venous abnormality within the limitations of this arterial phase study. Review of the MIP images confirms the above findings. NON-VASCULAR Lower chest: The visualized lung bases are clear. No intra-abdominal free air or free fluid. Hepatobiliary: Two subcentimeter enhancing foci in the left lobe of the liver (series 5, image 42 and 31) are not well characterized but may represent a flash filling hemangioma. The liver is otherwise unremarkable. No intrahepatic biliary ductal dilatation. The gallbladder is unremarkable. Pancreas: Unremarkable. No pancreatic ductal dilatation or surrounding inflammatory changes. Spleen: Normal in size without focal abnormality. Adrenals/Urinary Tract: Adrenal glands are unremarkable. Kidneys are normal, without renal calculi, focal lesion, or hydronephrosis. Bladder is unremarkable. Stomach/Bowel: There is no bowel obstruction or active inflammation. The appendix is normal. No abnormal enhancement or contrast pooling to suggest active bleed. Lymphatic: No significant vascular findings are present. No enlarged abdominal or pelvic lymph nodes. Reproductive: The  prostate and seminal vesicles are grossly unremarkable. No pelvic mass. Other: None Musculoskeletal: No acute or significant osseous findings. IMPRESSION: No acute intra-abdominal or pelvic pathology. No CT evidence of active GI bleed. Electronically Signed   By: Anner Crete M.D.   On: 10/15/2019 19:38      Impression / Plan:   Assessment: Active Problems:   GI bleed   Douglas Patton is a 33 y.o. y/o male with who was admitted with significant anemia.  The patient has not followed up with anybody since his last admission which resulted in him having an EGD and colonoscopy while inpatient without a source of the bleeding found except for some hemorrhoids.  He states that he has been working  very hard and unable to follow-up as an outpatient.  Plan:  The patient has been told that further work-up should include a capsule endoscopy to look at his small bowel since he has had a negative EGD and colonoscopy for any sign of his profound anemia.  He was offered to do the capsule endoscopy while in the hospital but refused and states he wants to do it as an outpatient.  The patient should be sent home on oral iron supplementation and avoid any NSAIDs.  The patient will follow up as an outpatient to set up the capsule endoscopy.  Thank you for involving me in the care of this patient.      LOS: 1 day   Douglas Lame, MD  10/16/2019, 1:33 PM Pager [phone] 7am-5pm  Check AMION for 5pm -7am coverage and on weekends   Note: This dictation was prepared with Dragon dictation along with smaller phrase technology. Any transcriptional errors that result from this process are unintentional.

## 2019-10-16 NOTE — Discharge Summary (Signed)
[facility] NAME: Douglas Patton    MR#:  [id number]  DATE OF BIRTH:  03-31-1986  DATE OF ADMISSION:  10/15/2019 ADMITTING PHYSICIAN: Saundra Shelling, MD  DATE OF DISCHARGE: 10/16/2019  PRIMARY CARE PHYSICIAN: Patient, No Pcp Per    ADMISSION DIAGNOSIS:  Acute GI bleeding [K92.2] Symptomatic anemia [D64.9]  DISCHARGE DIAGNOSIS:  Active Problems:   GI bleed   SECONDARY DIAGNOSIS:   Past Medical History:  Diagnosis Date  . Anemia   . Bronchitis   . Tobacco abuse     HOSPITAL COURSE:   33 year old male with past medical history of iron deficiency anemia who presented to the hospital due to rectal bleeding and also anemia.  1.  GI bleed-patient presented to the hospital with some mild rectal bleeding.  He was noted to be anemic.  He was transfused units of packed red blood cells, hemoglobin improved from 5.7-7.4. -He has had no further bleeding.  Seen by gastroenterology who recommended doing a capsule endoscopy as patient has had a recent colonoscopy and endoscopy which showed no acute pathology.  Patient's colonoscopy showed internal hemorrhoids and endoscopy was negative in March 2019. -Patient prefers to get the capsule endoscopy done as an outpatient. -He is tolerating p.o. well and has no acute bleeding and therefore being discharged home.  He was told to avoid NSAIDs.  2.  Iron deficiency anemia-secondary to the GI bleed. -Patient was transfused units of packed red blood cells and hemoglobin is improved as mentioned above.  Patient was also given 1 dose of IV iron.  He will continue his oral iron supplements. -I did refer him to hematology oncology as an outpatient for his chronic anemia.  3.  Tobacco abuse-patient was placed on nicotine patch while in the hospital he was strongly advised to abstain from smoking.  DISCHARGE CONDITIONS:   Stable  CONSULTS OBTAINED:    DRUG ALLERGIES:  No Known  Allergies  DISCHARGE MEDICATIONS:   Allergies as of 10/16/2019   No Known Allergies     Medication List    TAKE these medications   iron polysaccharides 150 MG capsule Commonly known as: NIFEREX Take 1 capsule (150 mg total) by mouth daily.   prochlorperazine 10 MG tablet Commonly known as: COMPAZINE Take 1 tablet (10 mg total) by mouth every 6 (six) hours as needed for nausea or vomiting.         DISCHARGE INSTRUCTIONS:   DIET:  Regular diet  DISCHARGE CONDITION:  Stable  ACTIVITY:  Activity as tolerated  OXYGEN:  Home Oxygen: No.   Oxygen Delivery: room air  DISCHARGE LOCATION:  home   If you experience worsening of your admission symptoms, develop shortness of breath, life threatening emergency, suicidal or homicidal thoughts you must seek medical attention immediately by calling 911 or calling your MD immediately  if symptoms less severe.  You Must read complete instructions/literature along with all the possible adverse reactions/side effects for all the Medicines you take and that have been prescribed to you. Take any new Medicines after you have completely understood and accpet all the possible adverse reactions/side effects.   Please note  You were cared for by a hospitalist during your hospital stay. If you have any questions about your discharge medications or the care you received while you were in the hospital after you are discharged, you can call the unit and asked to speak with the hospitalist on call if the hospitalist that took care of  you is not available. Once you are discharged, your primary care physician will handle any further medical issues. Please note that NO REFILLS for any discharge medications will be authorized once you are discharged, as it is imperative that you return to your primary care physician (or establish a relationship with a primary care physician if you do not have one) for your aftercare needs so that they can reassess your  need for medications and monitor your lab values.     Today   No acute events overnight, no further bleeding.  Hemoglobin stable.  Patient prefers to get his capsule endoscopy done as an outpatient and will discharge home home with outpatient follow-up with GI.  VITAL SIGNS:  Blood pressure (!) 99/53, pulse (!) 54, temperature 98.2 F (36.8 C), temperature source Oral, resp. rate 16, height 5\' 11"  (1.803 m), weight 82 kg, SpO2 100 %.  I/O:    Intake/Output Summary (Last 24 hours) at 10/16/2019 1409 Last data filed at 10/16/2019 1347 Gross per 24 hour  Intake 2441.04 ml  Output 675 ml  Net 1766.04 ml    PHYSICAL EXAMINATION:  GENERAL:  33 y.o.-year-old patient lying in the bed with no acute distress.  EYES: Pupils equal, round, reactive to light and accommodation. No scleral icterus. Extraocular muscles intact.  HEENT: Head atraumatic, normocephalic. Oropharynx and nasopharynx clear.  NECK:  Supple, no jugular venous distention. No thyroid enlargement, no tenderness.  LUNGS: Normal breath sounds bilaterally, no wheezing, rales,rhonchi. No use of accessory muscles of respiration.  CARDIOVASCULAR: S1, S2 normal. No murmurs, rubs, or gallops.  ABDOMEN: Soft, non-tender, non-distended. Bowel sounds present. No organomegaly or mass.  EXTREMITIES: No pedal edema, cyanosis, or clubbing.  NEUROLOGIC: Cranial nerves II through XII are intact. No focal motor or sensory defecits b/l.  PSYCHIATRIC: The patient is alert and oriented x 3.  SKIN: No obvious rash, lesion, or ulcer.   DATA REVIEW:   CBC Recent Labs  Lab 10/15/19 1332  10/16/19 1257  WBC 4.0  --   --   HGB 5.7*   < > 7.5*  HCT 21.9*   < > 26.1*  PLT 273  --   --    < > = values in this interval not displayed.    Chemistries  Recent Labs  Lab 10/15/19 1332 10/16/19 0443  NA 140 138  K 4.3 3.9  CL 105 107  CO2 27 25  GLUCOSE 98 86  BUN 15 12  CREATININE 1.16 1.12  CALCIUM 9.2 8.6*  AST 28  --   ALT 17  --    ALKPHOS 59  --   BILITOT 0.5  --     Cardiac Enzymes No results for input(s): TROPONINI in the last 168 hours.  Microbiology Results  Results for orders placed or performed during the hospital encounter of 10/15/19  SARS Coronavirus 2 by RT PCR (hospital order, performed in [facility] hospital lab) Nasopharyngeal Nasopharyngeal Swab     Status: None   Collection Time: 10/15/19  5:54 PM   Specimen: Nasopharyngeal Swab  Result Value Ref Range Status   SARS Coronavirus 2 NEGATIVE NEGATIVE Final    Comment: (NOTE) If result is NEGATIVE SARS-CoV-2 target nucleic acids are NOT DETECTED. The SARS-CoV-2 RNA is generally detectable in upper and lower  respiratory specimens during the acute phase of infection. The lowest  concentration of SARS-CoV-2 viral copies this assay can detect is 250  copies / mL. A negative result does not preclude SARS-CoV-2 infection  and  should not be used as the sole basis for treatment or other  patient management decisions.  A negative result may occur with  improper specimen collection / handling, submission of specimen other  than nasopharyngeal swab, presence of viral mutation(s) within the  areas targeted by this assay, and inadequate number of viral copies  (<250 copies / mL). A negative result must be combined with clinical  observations, patient history, and epidemiological information. If result is POSITIVE SARS-CoV-2 target nucleic acids are DETECTED. The SARS-CoV-2 RNA is generally detectable in upper and lower  respiratory specimens dur ing the acute phase of infection.  Positive  results are indicative of active infection with SARS-CoV-2.  Clinical  correlation with patient history and other diagnostic information is  necessary to determine patient infection status.  Positive results do  not rule out bacterial infection or co-infection with other viruses. If result is PRESUMPTIVE POSTIVE SARS-CoV-2 nucleic acids MAY BE PRESENT.   A  presumptive positive result was obtained on the submitted specimen  and confirmed on repeat testing.  While 2019 novel coronavirus  (SARS-CoV-2) nucleic acids may be present in the submitted sample  additional confirmatory testing may be necessary for epidemiological  and / or clinical management purposes  to differentiate between  SARS-CoV-2 and other Sarbecovirus currently known to infect humans.  If clinically indicated additional testing with an alternate test  methodology [phone]) is advised. The SARS-CoV-2 RNA is generally  detectable in upper and lower respiratory sp ecimens during the acute  phase of infection. The expected result is Negative. Fact Sheet for Patients:  StrictlyIdeas.no Fact Sheet for Healthcare Providers: BankingDealers.co.za This test is not yet approved or cleared by the [place] FDA and has been authorized for detection and/or diagnosis of SARS-CoV-2 by FDA under an Emergency Use Authorization (EUA).  This EUA will remain in effect (meaning this test can be used) for the duration of the COVID-19 declaration under Section 564(b)(1) of the Act, 21 U.S.C. section 360bbb-3(b)(1), unless the authorization is terminated or revoked sooner. Performed at [facility], [address] [place], [place] [address]     RADIOLOGY:  Ct Angio Abd/pel W/ And/or W/o  Result Date: 10/15/2019 CLINICAL DATA:  33 year old male with GI bleed. EXAM: CTA ABDOMEN AND PELVIS WITHOUT AND WITH CONTRAST TECHNIQUE: Multidetector CT imaging of the abdomen and pelvis was performed using the standard protocol during bolus administration of intravenous contrast. Multiplanar reconstructed images and MIPs were obtained and reviewed to evaluate the vascular anatomy. CONTRAST:  142mL OMNIPAQUE IOHEXOL 350 MG/ML SOLN COMPARISON:  CT abdomen pelvis dated 08/07/2018. FINDINGS: VASCULAR Aorta: Normal caliber aorta without aneurysm,  dissection, vasculitis or significant stenosis. Celiac: Patent without evidence of aneurysm, dissection, vasculitis or significant stenosis. SMA: Patent without evidence of aneurysm, dissection, vasculitis or significant stenosis. Renals: Both renal arteries are patent without evidence of aneurysm, dissection, vasculitis, fibromuscular dysplasia or significant stenosis. Duplicated left renal artery. IMA: Patent without evidence of aneurysm, dissection, vasculitis or significant stenosis. Inflow: Patent without evidence of aneurysm, dissection, vasculitis or significant stenosis. Proximal Outflow: Bilateral common femoral and visualized portions of the superficial and profunda femoral arteries are patent without evidence of aneurysm, dissection, vasculitis or significant stenosis. Veins: No obvious venous abnormality within the limitations of this arterial phase study. Review of the MIP images confirms the above findings. NON-VASCULAR Lower chest: The visualized lung bases are clear. No intra-abdominal free air or free fluid. Hepatobiliary: Two subcentimeter enhancing foci in the left lobe of the liver (series 5,  image 42 and 31) are not well characterized but may represent a flash filling hemangioma. The liver is otherwise unremarkable. No intrahepatic biliary ductal dilatation. The gallbladder is unremarkable. Pancreas: Unremarkable. No pancreatic ductal dilatation or surrounding inflammatory changes. Spleen: Normal in size without focal abnormality. Adrenals/Urinary Tract: Adrenal glands are unremarkable. Kidneys are normal, without renal calculi, focal lesion, or hydronephrosis. Bladder is unremarkable. Stomach/Bowel: There is no bowel obstruction or active inflammation. The appendix is normal. No abnormal enhancement or contrast pooling to suggest active bleed. Lymphatic: No significant vascular findings are present. No enlarged abdominal or pelvic lymph nodes. Reproductive: The prostate and seminal vesicles are  grossly unremarkable. No pelvic mass. Other: None Musculoskeletal: No acute or significant osseous findings. IMPRESSION: No acute intra-abdominal or pelvic pathology. No CT evidence of active GI bleed. Electronically Signed   By: Anner Crete M.D.   On: 10/15/2019 19:38      Management plans discussed with the patient, family and they are in agreement.  CODE STATUS:     Code Status Orders  (From admission, onward)         Start     Ordered   10/15/19 2017  Full code  Continuous     10/15/19 2016         TOTAL TIME TAKING CARE OF THIS PATIENT: 40 minutes.    Henreitta Leber M.D on 10/16/2019 at 2:09 PM  Between 7am to 6pm - Pager - [phone]  After 6pm go to [email or web address] - Proofreader  [facility]  Office  [phone]  CC: Primary care physician; Patient, No Pcp Per

## 2019-10-16 NOTE — Progress Notes (Signed)
Douglas Patton to be D/C'd Home per MD order.  Discussed prescriptions and follow up appointments with the patient. Prescriptions given to patient, medication list explained in detail. Pt verbalized understanding.  Allergies as of 10/16/2019   No Known Allergies     Medication List    TAKE these medications   iron polysaccharides 150 MG capsule Commonly known as: NIFEREX Take 1 capsule (150 mg total) by mouth daily.   prochlorperazine 10 MG tablet Commonly known as: COMPAZINE Take 1 tablet (10 mg total) by mouth every 6 (six) hours as needed for nausea or vomiting.       Vitals:   10/16/19 0111 10/16/19 0432  BP: 127/68 (!) 99/53  Pulse: 62 (!) 54  Resp: 20 16  Temp: 98.5 F (36.9 C) 98.2 F (36.8 C)  SpO2: 100% 100%    Skin clean, dry and intact without evidence of skin break down, no evidence of skin tears noted. IV catheter discontinued intact. Site without signs and symptoms of complications. Dressing and pressure applied. Pt denies pain at this time. No complaints noted.  An After Visit Summary was printed and given to the patient. Patient escorted via [place], and D/C home via private auto.  Douglas Mandril, RN

## 2019-10-29 NOTE — Progress Notes (Deleted)
Douglas Patton  [phone]) [phone] [phone]) [phone]  ID: Douglas Patton OB: Nov 15, 1986  MR#: [id number]  [id number]  Patient Care Team: Patient, No Pcp Per as PCP - General (General Practice)  CHIEF COMPLAINT: Anemia, unspecified.  INTERVAL HISTORY: ***  REVIEW OF SYSTEMS:   ROS  As per HPI. Otherwise, a complete review of systems is negative.  PAST MEDICAL HISTORY: Past Medical History:  Diagnosis Date  . Anemia   . Bronchitis   . Tobacco abuse     PAST SURGICAL HISTORY: Past Surgical History:  Procedure Laterality Date  . COLONOSCOPY N/A 03/04/2018   Procedure: COLONOSCOPY;  Surgeon: Lin Landsman, MD;  Location: [facility] ENDOSCOPY;  Service: Gastroenterology;  Laterality: N/A;  . ESOPHAGOGASTRODUODENOSCOPY N/A 03/04/2018   Procedure: ESOPHAGOGASTRODUODENOSCOPY (EGD);  Surgeon: Lin Landsman, MD;  Location: [facility] ENDOSCOPY;  Service: Gastroenterology;  Laterality: N/A;  . NO PAST SURGERIES      FAMILY HISTORY: Family History  Problem Relation Age of Onset  . Diabetes Mellitus II Mother   . CVA Father     ADVANCED DIRECTIVES (Y/N):  N  HEALTH MAINTENANCE: Social History   Tobacco Use  . Smoking status: Current Every Day Smoker  . Smokeless tobacco: Never Used  Substance Use Topics  . Alcohol use: Yes    Comment: social  . Drug use: No     Colonoscopy:  PAP:  Bone density:  Lipid panel:  No Known Allergies  Current Outpatient Medications  Medication Sig Dispense Refill  . iron polysaccharides (NIFEREX) 150 MG capsule Take 1 capsule (150 mg total) by mouth daily. 30 capsule 1  . prochlorperazine (COMPAZINE) 10 MG tablet Take 1 tablet (10 mg total) by mouth every 6 (six) hours as needed for nausea or vomiting. 12 tablet 0   No current facility-administered medications for this visit.     OBJECTIVE: There were no vitals filed for this visit.   There is no height or weight on file to calculate BMI.    ECOG FS:{CHL  ONC [id number]  General: Well-developed, well-nourished, no acute distress. Eyes: Pink conjunctiva, anicteric sclera. HEENT: Normocephalic, moist mucous membranes, clear oropharnyx. Lungs: Clear to auscultation bilaterally. Heart: Regular rate and rhythm. No rubs, murmurs, or gallops. Abdomen: Soft, nontender, nondistended. No organomegaly noted, normoactive bowel sounds. Musculoskeletal: No edema, cyanosis, or clubbing. Neuro: Alert, answering all questions appropriately. Cranial nerves grossly intact. Skin: No rashes or petechiae noted. Psych: Normal affect. Lymphatics: No cervical, calvicular, axillary or inguinal LAD.   LAB RESULTS:  Lab Results  Component Value Date   NA 138 10/16/2019   K 3.9 10/16/2019   CL 107 10/16/2019   CO2 25 10/16/2019   GLUCOSE 86 10/16/2019   BUN 12 10/16/2019   CREATININE 1.12 10/16/2019   CALCIUM 8.6 (L) 10/16/2019   PROT 7.6 10/15/2019   ALBUMIN 4.4 10/15/2019   AST 28 10/15/2019   ALT 17 10/15/2019   ALKPHOS 59 10/15/2019   BILITOT 0.5 10/15/2019   GFRNONAA >60 10/16/2019   GFRAA >60 10/16/2019    Lab Results  Component Value Date   WBC 4.0 10/15/2019   HGB 7.5 (L) 10/16/2019   HCT 26.1 (L) 10/16/2019   MCV 60.5 (L) 10/15/2019   PLT 273 10/15/2019     STUDIES: Ct Angio Abd/pel W/ And/or W/o  Result Date: 10/15/2019 CLINICAL DATA:  33 year old male with GI bleed. EXAM: CTA ABDOMEN AND PELVIS WITHOUT AND WITH CONTRAST TECHNIQUE: Multidetector CT imaging of the abdomen and pelvis was performed using the  standard protocol during bolus administration of intravenous contrast. Multiplanar reconstructed images and MIPs were obtained and reviewed to evaluate the vascular anatomy. CONTRAST:  136mL OMNIPAQUE IOHEXOL 350 MG/ML SOLN COMPARISON:  CT abdomen pelvis dated 08/07/2018. FINDINGS: VASCULAR Aorta: Normal caliber aorta without aneurysm, dissection, vasculitis or significant stenosis. Celiac: Patent without evidence of aneurysm,  dissection, vasculitis or significant stenosis. SMA: Patent without evidence of aneurysm, dissection, vasculitis or significant stenosis. Renals: Both renal arteries are patent without evidence of aneurysm, dissection, vasculitis, fibromuscular dysplasia or significant stenosis. Duplicated left renal artery. IMA: Patent without evidence of aneurysm, dissection, vasculitis or significant stenosis. Inflow: Patent without evidence of aneurysm, dissection, vasculitis or significant stenosis. Proximal Outflow: Bilateral common femoral and visualized portions of the superficial and profunda femoral arteries are patent without evidence of aneurysm, dissection, vasculitis or significant stenosis. Veins: No obvious venous abnormality within the limitations of this arterial phase study. Review of the MIP images confirms the above findings. NON-VASCULAR Lower chest: The visualized lung bases are clear. No intra-abdominal free air or free fluid. Hepatobiliary: Two subcentimeter enhancing foci in the left lobe of the liver (series 5, image 42 and 31) are not well characterized but may represent a flash filling hemangioma. The liver is otherwise unremarkable. No intrahepatic biliary ductal dilatation. The gallbladder is unremarkable. Pancreas: Unremarkable. No pancreatic ductal dilatation or surrounding inflammatory changes. Spleen: Normal in size without focal abnormality. Adrenals/Urinary Tract: Adrenal glands are unremarkable. Kidneys are normal, without renal calculi, focal lesion, or hydronephrosis. Bladder is unremarkable. Stomach/Bowel: There is no bowel obstruction or active inflammation. The appendix is normal. No abnormal enhancement or contrast pooling to suggest active bleed. Lymphatic: No significant vascular findings are present. No enlarged abdominal or pelvic lymph nodes. Reproductive: The prostate and seminal vesicles are grossly unremarkable. No pelvic mass. Other: None Musculoskeletal: No acute or significant  osseous findings. IMPRESSION: No acute intra-abdominal or pelvic pathology. No CT evidence of active GI bleed. Electronically Signed   By: Anner Crete M.D.   On: 10/15/2019 19:38    ASSESSMENT: Anemia, unspecified.  PLAN:    1. Anemia, unspecified:  Patient expressed understanding and was in agreement with this plan. He also understands that He can call clinic at any time with any questions, concerns, or complaints.   Cancer Staging No matching staging information was found for the patient.  Lloyd Huger, MD   10/29/2019 7:40 AM

## 2019-10-30 ENCOUNTER — Inpatient Hospital Stay: Payer: MEDICAID | Admitting: Oncology

## 2019-11-20 ENCOUNTER — Ambulatory Visit: Payer: Self-pay | Admitting: Gastroenterology

## 2019-11-20 ENCOUNTER — Encounter: Payer: Self-pay | Admitting: *Deleted

## 2020-05-18 ENCOUNTER — Inpatient Hospital Stay
Admission: EM | Admit: 2020-05-18 | Discharge: 2020-05-19 | DRG: 378 | Disposition: A | Discharge status: Home / Self Care | Payer: Self-pay | Attending: Internal Medicine | Admitting: Internal Medicine

## 2020-05-18 ENCOUNTER — Inpatient Hospital Stay: Payer: Self-pay

## 2020-05-18 ENCOUNTER — Other Ambulatory Visit: Payer: Self-pay

## 2020-05-18 ENCOUNTER — Encounter: Payer: Self-pay | Admitting: Emergency Medicine

## 2020-05-18 DIAGNOSIS — D62 Acute posthemorrhagic anemia: Secondary | ICD-10-CM | POA: Diagnosis present

## 2020-05-18 DIAGNOSIS — K922 Gastrointestinal hemorrhage, unspecified: Secondary | ICD-10-CM | POA: Diagnosis present

## 2020-05-18 DIAGNOSIS — K921 Melena: Principal | ICD-10-CM | POA: Diagnosis present

## 2020-05-18 DIAGNOSIS — Z720 Tobacco use: Secondary | ICD-10-CM

## 2020-05-18 DIAGNOSIS — K589 Irritable bowel syndrome without diarrhea: Secondary | ICD-10-CM | POA: Diagnosis present

## 2020-05-18 DIAGNOSIS — K621 Rectal polyp: Secondary | ICD-10-CM | POA: Diagnosis present

## 2020-05-18 DIAGNOSIS — R109 Unspecified abdominal pain: Secondary | ICD-10-CM

## 2020-05-18 DIAGNOSIS — F17200 Nicotine dependence, unspecified, uncomplicated: Secondary | ICD-10-CM | POA: Diagnosis present

## 2020-05-18 DIAGNOSIS — Z20822 Contact with and (suspected) exposure to covid-19: Secondary | ICD-10-CM | POA: Diagnosis present

## 2020-05-18 DIAGNOSIS — K642 Third degree hemorrhoids: Secondary | ICD-10-CM | POA: Diagnosis present

## 2020-05-18 DIAGNOSIS — K3189 Other diseases of stomach and duodenum: Secondary | ICD-10-CM | POA: Diagnosis present

## 2020-05-18 LAB — SARS CORONAVIRUS 2 BY RT PCR (HOSPITAL ORDER, PERFORMED IN ~~LOC~~ HOSPITAL LAB): SARS Coronavirus 2: NEGATIVE

## 2020-05-18 LAB — COMPREHENSIVE METABOLIC PANEL
ALT: 13 U/L (ref 0–44)
AST: 21 U/L (ref 15–41)
Albumin: 4.4 g/dL (ref 3.5–5.0)
Alkaline Phosphatase: 55 U/L (ref 38–126)
Anion gap: 9 (ref 5–15)
BUN: 15 mg/dL (ref 6–20)
CO2: 24 mmol/L (ref 22–32)
Calcium: 8.7 mg/dL — ABNORMAL LOW (ref 8.9–10.3)
Chloride: 103 mmol/L (ref 98–111)
Creatinine, Ser: 1.04 mg/dL (ref 0.61–1.24)
GFR calc Af Amer: >60 mL/min (ref >60)
GFR calc non Af Amer: >60 mL/min (ref >60)
Glucose, Bld: 111 mg/dL — ABNORMAL HIGH (ref 70–99)
Potassium: 4.1 mmol/L (ref 3.5–5.1)
Sodium: 136 mmol/L (ref 135–145)
Total Bilirubin: 0.5 mg/dL (ref 0.3–1.2)
Total Protein: 7.3 g/dL (ref 6.5–8.1)

## 2020-05-18 LAB — CBC
HCT: 17.8 % — ABNORMAL LOW (ref 39.0–52.0)
HCT: 20.3 % — ABNORMAL LOW (ref 39.0–52.0)
HCT: 24 % — ABNORMAL LOW (ref 39.0–52.0)
Hemoglobin: 4.9 g/dL — CL (ref 13.0–17.0)
Hemoglobin: 6.1 g/dL — ABNORMAL LOW (ref 13.0–17.0)
Hemoglobin: 7.4 g/dL — ABNORMAL LOW (ref 13.0–17.0)
MCH: 16.7 pg — ABNORMAL LOW (ref 26.0–34.0)
MCH: 19.5 pg — ABNORMAL LOW (ref 26.0–34.0)
MCH: 20.8 pg — ABNORMAL LOW (ref 26.0–34.0)
MCHC: 27.5 g/dL — ABNORMAL LOW (ref 30.0–36.0)
MCHC: 30 g/dL (ref 30.0–36.0)
MCHC: 30.8 g/dL (ref 30.0–36.0)
MCV: 60.8 fL — ABNORMAL LOW (ref 80.0–100.0)
MCV: 64.9 fL — ABNORMAL LOW (ref 80.0–100.0)
MCV: 67.4 fL — ABNORMAL LOW (ref 80.0–100.0)
Platelets: 246 10*3/uL (ref 150–400)
Platelets: 188 10*3/uL (ref 150–400)
Platelets: 182 10*3/uL (ref 150–400)
RBC: 2.93 MIL/uL — ABNORMAL LOW (ref 4.22–5.81)
RBC: 3.13 MIL/uL — ABNORMAL LOW (ref 4.22–5.81)
RBC: 3.56 MIL/uL — ABNORMAL LOW (ref 4.22–5.81)
RDW: 19.3 % — ABNORMAL HIGH (ref 11.5–15.5)
RDW: 22.5 % — ABNORMAL HIGH (ref 11.5–15.5)
RDW: 23.8 % — ABNORMAL HIGH (ref 11.5–15.5)
WBC: 5.7 10*3/uL (ref 4.0–10.5)
WBC: 5.4 10*3/uL (ref 4.0–10.5)
WBC: 4.1 10*3/uL (ref 4.0–10.5)
nRBC: 0 % (ref 0.0–0.2)
nRBC: 0 % (ref 0.0–0.2)
nRBC: 0 % (ref 0.0–0.2)

## 2020-05-18 LAB — PREPARE RBC (CROSSMATCH)

## 2020-05-18 LAB — BASIC METABOLIC PANEL WITH GFR
Anion gap: 7 (ref 5–15)
BUN: 13 mg/dL (ref 6–20)
CO2: 24 mmol/L (ref 22–32)
Calcium: 8.2 mg/dL — ABNORMAL LOW (ref 8.9–10.3)
Chloride: 105 mmol/L (ref 98–111)
Creatinine, Ser: 1.09 mg/dL (ref 0.61–1.24)
GFR calc Af Amer: >60 mL/min (ref >60)
GFR calc non Af Amer: >60 mL/min (ref >60)
Glucose, Bld: 90 mg/dL (ref 70–99)
Potassium: 3.9 mmol/L (ref 3.5–5.1)
Sodium: 136 mmol/L (ref 135–145)

## 2020-05-18 MED ORDER — TRAZODONE HCL 50 MG PO TABS
25.0000 mg | ORAL_TABLET | Freq: Every evening | ORAL | Status: DC | PRN
  Filled 2020-05-18: qty 1

## 2020-05-18 MED ORDER — PROCHLORPERAZINE MALEATE 10 MG PO TABS: 10.0000 mg | ORAL_TABLET | Freq: Four times a day (QID) | ORAL | Status: DC | PRN

## 2020-05-18 MED ORDER — SODIUM CHLORIDE 0.9 % IV SOLN
10.0000 mL/h | Freq: Once | INTRAVENOUS | Status: AC
  Administered 2020-05-18: 10 mL/h via INTRAVENOUS

## 2020-05-18 MED ORDER — POLYETHYLENE GLYCOL 3350 17 GM/SCOOP PO POWD
1.0000 | Freq: Once | ORAL | Status: AC
  Administered 2020-05-18: 255 g via ORAL
  Filled 2020-05-18: qty 255

## 2020-05-18 MED ORDER — SODIUM CHLORIDE 0.9% IV SOLUTION
Freq: Once | INTRAVENOUS | Status: AC
  Filled 2020-05-18: qty 250

## 2020-05-18 MED ORDER — PEG 3350-KCL-NA BICARB-NACL 420 G PO SOLR
4000.0000 mL | Freq: Once | ORAL | Status: AC
  Administered 2020-05-18: 4000 mL via ORAL
  Filled 2020-05-18: qty 4000

## 2020-05-18 MED ORDER — ONDANSETRON HCL 4 MG/2ML IJ SOLN: 4.0000 mg | Freq: Four times a day (QID) | INTRAMUSCULAR | Status: DC | PRN

## 2020-05-18 MED ORDER — ONDANSETRON HCL 4 MG PO TABS: 4.0000 mg | ORAL_TABLET | Freq: Four times a day (QID) | ORAL | Status: DC | PRN

## 2020-05-18 MED ORDER — SODIUM CHLORIDE 0.9 % IV SOLN
8.0000 mg/h | INTRAVENOUS | Status: DC
  Administered 2020-05-18 – 2020-05-19 (×2): 8 mg/h via INTRAVENOUS
  Filled 2020-05-18 (×4): qty 80

## 2020-05-18 MED ORDER — ACETAMINOPHEN 650 MG RE SUPP: 650.0000 mg | Freq: Four times a day (QID) | RECTAL | Status: DC | PRN

## 2020-05-18 MED ORDER — SODIUM CHLORIDE 0.9 % IV SOLN: INTRAVENOUS | Status: DC

## 2020-05-18 MED ORDER — MORPHINE SULFATE (PF) 2 MG/ML IV SOLN: 2.0000 mg | INTRAVENOUS | Status: DC | PRN

## 2020-05-18 MED ORDER — ACETAMINOPHEN 325 MG PO TABS: 650.0000 mg | ORAL_TABLET | Freq: Four times a day (QID) | ORAL | Status: DC | PRN

## 2020-05-18 MED ORDER — POLYSACCHARIDE IRON COMPLEX 150 MG PO CAPS: 150.0000 mg | ORAL_CAPSULE | Freq: Every day | ORAL | Status: DC

## 2020-05-18 MED ORDER — SODIUM CHLORIDE 0.9 % IV SOLN
80.0000 mg | Freq: Once | INTRAVENOUS | Status: AC
  Administered 2020-05-18: 80 mg via INTRAVENOUS
  Filled 2020-05-18: qty 80

## 2020-05-18 NOTE — Consult Note (Signed)
GI Inpatient Consult Note  Reason for Consult: Acute blood loss anemia, heme positive stool, hematochezia   Attending Requesting Consult: Dr. Eugenie Norrie, MD  History of Present Illness: Douglas Patton is a 34 y.o. male seen for evaluation of acute blood loss anemia 2/2 hematochezia and heme positive stool at the request of Dr. Sidney Ace. Pt reports he began to experience LLQ and suprapubic abdominal pain associated with tenesmus and hematochezia approximately four days ago. He noticed dark red blood and clots in the toilet bowl. He would experience a several second period of relief after passing a BM, but then the lower abd pain would resume. 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 Upon presenation to the ED, he was hypertensive, HR 100, and blood work showed profound anemia with hemoglobin 4.9, hematocrit 17.8, and MCV 60. He was transfused 2 units pRBCs and started on Protoinx. He had brown stool in rectal vault which was guaiac positive per ED physician. Pt was previously admitted at [facility] 02/2018 for hematochezia with profound anemia where Dr. Marius Ditch performed bidirectional endoscopy which was negative for any acute bleeding source. He was discharged and lost to follow-up. He presented back to the ED 10/2018 for symptomatic anemia with hemoglobin 6.5, MCV 62 and transfused and discharged on oral iron with outpatient hematology follow-up but did not follow-up. He was admitted 10/16/19 for rectal bleeding with hemoglobin 5.7 and seen by GI inpatient and advised to have capsule endoscopy as outpatient; however, patient did not follow-up as an outpatient. He did have CT angiogram abd/pelvis which was negative at the time. Patient denies any known family history of Crohn's disease or ulcerative colitis or colon cancer. He denies any unintentional weight loss, new skin rashes,  recurrent mouth ulcers, or joint pains. He denies any NSAID use. He does use 1/2-1 ppd and denies any other illicit drug use. He denies any nausea, vomiting, dysphagia, odynophagia, early satiety, hematemesis.     Last Colonoscopy: 02/2018 -Grade III internal hemorrhoids -One 5 mm polyp in descending colon -Diverticulosis in ascending colon -Normal mucosa throughout entire colon Last Endoscopy: 02/2018 -Normal esophagus -Normal stomach -Normal duodenum -No upper GI source of bleeding identifiied    Past Medical History:  Past Medical History:  Diagnosis Date  . Anemia   . Bronchitis   . Tobacco abuse     Problem List: Patient Active Problem List   Diagnosis Date Noted  . GI bleeding 05/18/2020  . GI bleed 03/02/2018  . Symptomatic anemia 03/02/2018  . TOBACCO DEPENDENCE 02/24/2007  . RHINITIS, ALLERGIC 02/24/2007  . ACNE 02/24/2007    Past Surgical History: Past Surgical History:  Procedure Laterality Date  . COLONOSCOPY N/A 03/04/2018   Procedure: COLONOSCOPY;  Surgeon: Lin Landsman, MD;  Location: [facility] ENDOSCOPY;  Service: Gastroenterology;  Laterality: N/A;  . ESOPHAGOGASTRODUODENOSCOPY N/A 03/04/2018   Procedure: ESOPHAGOGASTRODUODENOSCOPY (EGD);  Surgeon: Lin Landsman, MD;  Location: [facility] ENDOSCOPY;  Service: Gastroenterology;  Laterality: N/A;  . NO PAST SURGERIES      Allergies: No Known Allergies  Home Medications: (Not in a hospital admission)  Home medication reconciliation was completed with the patient.   Scheduled Inpatient Medications:   . sodium chloride   Intravenous Once    Continuous Inpatient Infusions:   . sodium chloride 100 mL/hr at 05/18/20 0424  . pantoprozole (PROTONIX) infusion 8 mg/hr (05/18/20  52)    PRN Inpatient Medications:  acetaminophen **OR** acetaminophen, ondansetron **OR** ondansetron (ZOFRAN) IV, traZODone  Family History: family history includes CVA in his father; Diabetes Mellitus II in his mother.   The patient's family history is negative for inflammatory bowel disorders, GI malignancy, or solid organ transplantation.  Social History:   reports that he has been smoking. He has never used smokeless tobacco. He reports previous alcohol use. He reports that he does not use drugs. The patient denies ETOH, tobacco, or drug use.   Review of Systems: Constitutional: Weight is stable.  Eyes: No changes in vision. ENT: No oral lesions, sore throat.  GI: see HPI.  Heme/Lymph: No easy bruising.  CV: No chest pain.  GU: No hematuria.  Integumentary: No rashes.  Neuro: No headaches.  Psych: No depression/anxiety.  Endocrine: No heat/cold intolerance.  Allergic/Immunologic: No urticaria.  Resp: No cough, SOB.  Musculoskeletal: No joint swelling.    Physical Examination: BP 125/70   Pulse 76   Temp 97.9 F (36.6 C) (Oral)   Resp 16   Ht 5\' 11"  (1.803 m)   Wt 90.7 kg   SpO2 99%   BMI 27.89 kg/m  Gen: NAD, alert and oriented x 4 HEENT: PEERLA, EOMI, Neck: supple, no JVD or thyromegaly Chest: CTA bilaterally, no wheezes, crackles, or other adventitious sounds CV: RRR, no m/g/c/r Abd: soft, ND, +BS in all four quadrants; no HSM, guarding, ridigity, or rebound tenderness Ext: no edema, well perfused with 2+ pulses, Skin: no rash or lesions noted Lymph: no LAD  Data: Lab Results  Component Value Date   WBC 5.4 05/18/2020   HGB 6.1 (L) 05/18/2020   HCT 20.3 (L) 05/18/2020   MCV 64.9 (L) 05/18/2020   PLT 188 05/18/2020   Recent Labs  Lab 05/18/20 0118 05/18/20 0533  HGB 4.9* 6.1*   Lab Results  Component Value Date   NA 136 05/18/2020   K 3.9 05/18/2020   CL 105 05/18/2020   CO2 24 05/18/2020   BUN 13 05/18/2020   CREATININE 1.09 05/18/2020   Lab Results  Component Value Date   ALT 13 05/18/2020   AST 21 05/18/2020   ALKPHOS 55 05/18/2020   BILITOT 0.5 05/18/2020   No results for input(s): APTT, INR, PTT in the last 168 hours. Assessment/Plan:  34 y/o AA  male with a PMH of IDA and previous GI bleeding 02/2018 and 09/2019 with no identifiable cause admitted for hematochezia and severe IDA   1. Acute blood loss anemia 2/2 hematochezia - DDx includes inflammatory bowel disease, AVMs, colon polyp, anal outlet bleeding, small bowel source, Dieulafoy's lesion, PUD, gastritis, malignancy, or myelodysplastic syndrome  2. LLQ/Suprapubic abdominal pain   3. Iron deficiency anemia - hemoglobin 4.9, MCV 60 upon admission  4. Heme positive stool    Recommendations:  1. Agree with transfusions as necessary with goal of Hgb >7.0 2. Agree with acid suppression with Protonix IV 3. No evidence of active bleeding. Unlikely that his profound IDA is caused by known hx of Grade III internal hemorrhoids 4. Advise EGD and colonoscopy for luminal evaluation.  5. Plan for EGD and colonoscopy tomorrow morning with Dr. Alice Reichert. If EGD and colonoscopy are negative, consider video capsule endoscopy to assess for small bowel bleeding 6. Clear liquid diet today. NPO after midnight. Bowel prep 1700 today.   I reviewed the risks (including bleeding, perforation, infection, anesthesia complications, cardiac/respiratory complications), benefits and alternatives of EGD and colonsocopy. Patient consents to proceed.  Thank you for the consult. Please call with questions or concerns.  Reeves Forth [facility] [phone] (Cell)

## 2020-05-18 NOTE — ED Provider Notes (Signed)
[facility] Emergency Department Provider Note ____________________________________________   First MD Initiated Contact with Patient 05/18/20 0158     (approximate)  I have reviewed the triage vital signs and the nursing notes.   HISTORY  Chief Complaint Abdominal Pain and Rectal Bleeding    HPI LLOYD TOLLY is a 34 y.o. male with PMH as noted below including a prior history of GI bleed due to unknown source who presents with blood in the stool, acute onset 3 days ago, occurring with bowel movements, and associated with some left-sided abdominal pain.  He describes the blood is mostly dark red, with some occasional bright red blood but no black or tarry stools.  The patient also reports significant generalized weakness and lightheadedness, especially over the last day.  He denies any vomiting, fever chills, or other abnormal bleeding or bruising.   Past Medical History:  Diagnosis Date  . Anemia   . Bronchitis   . Tobacco abuse     Patient Active Problem List   Diagnosis Date Noted  . GI bleeding 05/18/2020  . GI bleed 03/02/2018  . Symptomatic anemia 03/02/2018  . TOBACCO DEPENDENCE 02/24/2007  . RHINITIS, ALLERGIC 02/24/2007  . ACNE 02/24/2007    Past Surgical History:  Procedure Laterality Date  . COLONOSCOPY N/A 03/04/2018   Procedure: COLONOSCOPY;  Surgeon: Lin Landsman, MD;  Location: [facility] ENDOSCOPY;  Service: Gastroenterology;  Laterality: N/A;  . ESOPHAGOGASTRODUODENOSCOPY N/A 03/04/2018   Procedure: ESOPHAGOGASTRODUODENOSCOPY (EGD);  Surgeon: Lin Landsman, MD;  Location: [facility] ENDOSCOPY;  Service: Gastroenterology;  Laterality: N/A;  . NO PAST SURGERIES      Prior to Admission medications   Medication Sig Start Date End Date Taking? Authorizing Provider  iron polysaccharides (NIFEREX) 150 MG capsule Take 1 capsule (150 mg total) by mouth daily. 03/04/18   Fritzi Mandes, MD  prochlorperazine (COMPAZINE) 10 MG tablet Take 1  tablet (10 mg total) by mouth every 6 (six) hours as needed for nausea or vomiting. 08/06/18   Merlyn Lot, MD    Allergies Patient has no known allergies.  Family History  Problem Relation Age of Onset  . Diabetes Mellitus II Mother   . CVA Father     Social History Social History   Tobacco Use  . Smoking status: Current Every Day Smoker  . Smokeless tobacco: Never Used  Substance Use Topics  . Alcohol use: Not Currently  . Drug use: No    Review of Systems  Constitutional: No fever/chills.  Positive for generalized weakness. Eyes: No redness. ENT: No sore throat. Cardiovascular: Denies chest pain. Respiratory: Denies shortness of breath. Gastrointestinal: No vomiting.  Positive for blood in the stool. Genitourinary: Negative for hematuria. Musculoskeletal: Negative for back pain. Skin: Negative for rash. Neurological: Negative for headache.   ____________________________________________   PHYSICAL EXAM:  VITAL SIGNS: ED Triage Vitals [05/18/20 0111]  Enc Vitals Group     BP (!) 152/67     Pulse Rate 100     Resp 20     Temp 98.9 F (37.2 C)     Temp Source Oral     SpO2 100 %     Weight 200 lb (90.7 kg)     Height 5\' 11"  (1.803 m)     Head Circumference      Peak Flow      Pain Score 8     Pain Loc      Pain Edu?      Excl. in [place]?  Constitutional: Alert and oriented.  Slightly tired appearing but in no acute distress. Eyes: Pale conjunctiva. Head: Atraumatic. Nose: No congestion/rhinnorhea. Mouth/Throat: Mucous membranes are moist.   Neck: Normal range of motion.  Cardiovascular: Normal rate, regular rhythm.  Good peripheral circulation. Respiratory: Normal respiratory effort.  No retractions.  Gastrointestinal: Soft with mild left mid abdominal tenderness.  No distention.  Brown stool, guaiac positive on DRE. Genitourinary: No flank tenderness. Musculoskeletal:  Extremities warm and well perfused.  Neurologic:  Normal speech and  language. No gross focal neurologic deficits are appreciated.  Skin:  Skin is warm and dry. No rash noted. Psychiatric: Mood and affect are normal. Speech and behavior are normal.  ____________________________________________   LABS (all labs ordered are listed, but only abnormal results are displayed)  Labs Reviewed  COMPREHENSIVE METABOLIC PANEL - Abnormal; Notable for the following components:      Result Value   Glucose, Bld 111 (*)    Calcium 8.7 (*)    All other components within normal limits  CBC - Abnormal; Notable for the following components:   RBC 2.93 (*)    Hemoglobin 4.9 (*)    HCT 17.8 (*)    MCV 60.8 (*)    MCH 16.7 (*)    MCHC 27.5 (*)    RDW 19.3 (*)    All other components within normal limits  SARS CORONAVIRUS 2 BY RT PCR (HOSPITAL ORDER, [place] [facility])  TYPE AND SCREEN  PREPARE RBC (CROSSMATCH)   ____________________________________________  EKG  ED ECG REPORT I, Arta Silence, the attending physician, personally viewed and interpreted this ECG.  Date: 05/18/2020 EKG Time: 0113 Rate: 89 Rhythm: normal sinus rhythm QRS Axis: normal Intervals: normal ST/T Wave abnormalities: normal Narrative Interpretation: no evidence of acute ischemia  ____________________________________________  RADIOLOGY    ____________________________________________   PROCEDURES  Procedure(s) performed: No  Procedures  Critical Care performed: Yes  CRITICAL CARE Performed by: Arta Silence   Total critical care time: 30 minutes  Critical care time was exclusive of separately billable procedures and treating other patients.  Critical care was necessary to treat or prevent imminent or life-threatening deterioration.  Critical care was time spent personally by me on the following activities: development of treatment plan with patient and/or surrogate as well as nursing, discussions with consultants, evaluation of patient's  response to treatment, examination of patient, obtaining history from patient or surrogate, ordering and performing treatments and interventions, ordering and review of laboratory studies, ordering and review of radiographic studies, pulse oximetry and re-evaluation of patient's condition. ____________________________________________   INITIAL IMPRESSION / ASSESSMENT AND PLAN / ED COURSE  Pertinent labs & imaging results that were available during my care of the patient were reviewed by me and considered in my medical decision making (see chart for details).  34 year old male with PMH as noted above presents with blood in the stool over the last 3 days and generalized weakness.  The patient has a prior history of GI bleed but states it was never determined what was causing it, and he did not follow-up with GI as an outpatient.  I reviewed the past medical records in [place].  The patient was admitted in 2019 with a similar presentation.  He had a colonoscopy which showed internal hemorrhoids, and upper endoscopy which was negative.  He subsequently was admitted for severe anemia last year and reported blood in the stool, although ultimately a decision was made to have capsule endoscopy as an outpatient.  He did  not have repeat upper or lower endoscopy at that time.  He also never followed up with GI.  On exam today, the patient is somewhat weak appearing but in no acute distress.  His vital signs are normal.  The abdomen is soft with mild tenderness to the left lateral flank.  The exam is otherwise as described above.  Overall presentation is most consistent with lower GI bleed, although the source is unclear.  The patient has no history of upper GI bleed, negative prior upper endoscopy, and no significant NSAID or alcohol use that would suggest current upper GI source.  However, I have started the patient on Protonix.  His hemoglobin is currently 4.9.  I have ordered 2 units of blood as well.  Plan will  be for admission.  ----------------------------------------- 3:46 AM on 05/18/2020 -----------------------------------------  I consulted Dr. Alice Reichert from GI who agrees with the current management and plans to see the patient in the morning.  I then discussed the case with Dr. Gayla Medicus from the hospitalist service for admission.  The patient is currently receiving blood and remains hemodynamically stable.  ____________________________________________   FINAL CLINICAL IMPRESSION(S) / ED DIAGNOSES  Final diagnoses:  Acute GI bleeding      NEW MEDICATIONS STARTED DURING THIS VISIT:  New Prescriptions   No medications on file     Note:  This document was prepared using Dragon voice recognition software and may include unintentional dictation errors.    Arta Silence, MD 05/18/20 [id number]

## 2020-05-18 NOTE — ED Triage Notes (Signed)
Patient with complaint of rectal bleeding times three days. Patient states that it varies from bright red to dark blood. Patient states that he started having left side abdominal pain today. Patient states that he has had rectal bleeding in the past but never found out what caused the bleeding.

## 2020-05-18 NOTE — Progress Notes (Signed)
[facility] PLAN OF CARE NOTE Patient: Douglas Patton [id number]   PCP: Patient, No Pcp Per DOB: [birth year]   DOA: 05/18/2020   DOS: 05/18/2020    Patient was admitted by my colleague Dr. Sidney Ace earlier on 05/18/2020. I have reviewed the H&P as well as assessment and plan and agree with the same. Important changes in the plan are listed below.  Plan of care: Active Problems:   GI bleeding check x ray abdominal  Appreciate GI recommendation.  Procedure tomorrow   Author: Berle Mull, MD [facility] 05/18/2020 12:56 PM   If 7PM-7AM, please contact night-coverage at [email or web address]

## 2020-05-18 NOTE — H&P (View-Only) (Signed)
GI Inpatient Consult Note  Reason for Consult: Acute blood loss anemia, heme positive stool, hematochezia   Attending Requesting Consult: Dr. Eugenie Norrie, MD  History of Present Illness: Douglas Patton is a 34 y.o. male seen for evaluation of acute blood loss anemia 2/2 hematochezia and heme positive stool at the request of Dr. Sidney Ace. Pt reports he began to experience LLQ and suprapubic abdominal pain associated with tenesmus and hematochezia approximately four days ago. He noticed dark red blood and clots in the toilet bowl. He would experience a several second period of relief after passing a BM, but then the lower abd pain would resume. 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 Upon presenation to the ED, he was hypertensive, HR 100, and blood work showed profound anemia with hemoglobin 4.9, hematocrit 17.8, and MCV 60. He was transfused 2 units pRBCs and started on Protoinx. He had brown stool in rectal vault which was guaiac positive per ED physician. Pt was previously admitted at [facility] 02/2018 for hematochezia with profound anemia where Dr. Marius Ditch performed bidirectional endoscopy which was negative for any acute bleeding source. He was discharged and lost to follow-up. He presented back to the ED 10/2018 for symptomatic anemia with hemoglobin 6.5, MCV 62 and transfused and discharged on oral iron with outpatient hematology follow-up but did not follow-up. He was admitted 10/16/19 for rectal bleeding with hemoglobin 5.7 and seen by GI inpatient and advised to have capsule endoscopy as outpatient; however, patient did not follow-up as an outpatient. He did have CT angiogram abd/pelvis which was negative at the time. Patient denies any known family history of Crohn's disease or ulcerative colitis or colon cancer. He denies any unintentional weight loss, new skin rashes,  recurrent mouth ulcers, or joint pains. He denies any NSAID use. He does use 1/2-1 ppd and denies any other illicit drug use. He denies any nausea, vomiting, dysphagia, odynophagia, early satiety, hematemesis.     Last Colonoscopy: 02/2018 -Grade III internal hemorrhoids -One 5 mm polyp in descending colon -Diverticulosis in ascending colon -Normal mucosa throughout entire colon Last Endoscopy: 02/2018 -Normal esophagus -Normal stomach -Normal duodenum -No upper GI source of bleeding identifiied    Past Medical History:  Past Medical History:  Diagnosis Date  . Anemia   . Bronchitis   . Tobacco abuse     Problem List: Patient Active Problem List   Diagnosis Date Noted  . GI bleeding 05/18/2020  . GI bleed 03/02/2018  . Symptomatic anemia 03/02/2018  . TOBACCO DEPENDENCE 02/24/2007  . RHINITIS, ALLERGIC 02/24/2007  . ACNE 02/24/2007    Past Surgical History: Past Surgical History:  Procedure Laterality Date  . COLONOSCOPY N/A 03/04/2018   Procedure: COLONOSCOPY;  Surgeon: Lin Landsman, MD;  Location: [facility] ENDOSCOPY;  Service: Gastroenterology;  Laterality: N/A;  . ESOPHAGOGASTRODUODENOSCOPY N/A 03/04/2018   Procedure: ESOPHAGOGASTRODUODENOSCOPY (EGD);  Surgeon: Lin Landsman, MD;  Location: [facility] ENDOSCOPY;  Service: Gastroenterology;  Laterality: N/A;  . NO PAST SURGERIES      Allergies: No Known Allergies  Home Medications: (Not in a hospital admission)  Home medication reconciliation was completed with the patient.   Scheduled Inpatient Medications:   . sodium chloride   Intravenous Once    Continuous Inpatient Infusions:   . sodium chloride 100 mL/hr at 05/18/20 0424  . pantoprozole (PROTONIX) infusion 8 mg/hr (05/18/20  78)    PRN Inpatient Medications:  acetaminophen **OR** acetaminophen, ondansetron **OR** ondansetron (ZOFRAN) IV, traZODone  Family History: family history includes CVA in his father; Diabetes Mellitus II in his mother.   The patient's family history is negative for inflammatory bowel disorders, GI malignancy, or solid organ transplantation.  Social History:   reports that he has been smoking. He has never used smokeless tobacco. He reports previous alcohol use. He reports that he does not use drugs. The patient denies ETOH, tobacco, or drug use.   Review of Systems: Constitutional: Weight is stable.  Eyes: No changes in vision. ENT: No oral lesions, sore throat.  GI: see HPI.  Heme/Lymph: No easy bruising.  CV: No chest pain.  GU: No hematuria.  Integumentary: No rashes.  Neuro: No headaches.  Psych: No depression/anxiety.  Endocrine: No heat/cold intolerance.  Allergic/Immunologic: No urticaria.  Resp: No cough, SOB.  Musculoskeletal: No joint swelling.    Physical Examination: BP 125/70   Pulse 76   Temp 97.9 F (36.6 C) (Oral)   Resp 16   Ht 5\' 11"  (1.803 m)   Wt 90.7 kg   SpO2 99%   BMI 27.89 kg/m  Gen: NAD, alert and oriented x 4 HEENT: PEERLA, EOMI, Neck: supple, no JVD or thyromegaly Chest: CTA bilaterally, no wheezes, crackles, or other adventitious sounds CV: RRR, no m/g/c/r Abd: soft, ND, +BS in all four quadrants; no HSM, guarding, ridigity, or rebound tenderness Ext: no edema, well perfused with 2+ pulses, Skin: no rash or lesions noted Lymph: no LAD  Data: Lab Results  Component Value Date   WBC 5.4 05/18/2020   HGB 6.1 (L) 05/18/2020   HCT 20.3 (L) 05/18/2020   MCV 64.9 (L) 05/18/2020   PLT 188 05/18/2020   Recent Labs  Lab 05/18/20 0118 05/18/20 0533  HGB 4.9* 6.1*   Lab Results  Component Value Date   NA 136 05/18/2020   K 3.9 05/18/2020   CL 105 05/18/2020   CO2 24 05/18/2020   BUN 13 05/18/2020   CREATININE 1.09 05/18/2020   Lab Results  Component Value Date   ALT 13 05/18/2020   AST 21 05/18/2020   ALKPHOS 55 05/18/2020   BILITOT 0.5 05/18/2020   No results for input(s): APTT, INR, PTT in the last 168 hours. Assessment/Plan:  34 y/o AA  male with a PMH of IDA and previous GI bleeding 02/2018 and 09/2019 with no identifiable cause admitted for hematochezia and severe IDA   1. Acute blood loss anemia 2/2 hematochezia - DDx includes inflammatory bowel disease, AVMs, colon polyp, anal outlet bleeding, small bowel source, Dieulafoy's lesion, PUD, gastritis, malignancy, or myelodysplastic syndrome  2. LLQ/Suprapubic abdominal pain   3. Iron deficiency anemia - hemoglobin 4.9, MCV 60 upon admission  4. Heme positive stool    Recommendations:  1. Agree with transfusions as necessary with goal of Hgb >7.0 2. Agree with acid suppression with Protonix IV 3. No evidence of active bleeding. Unlikely that his profound IDA is caused by known hx of Grade III internal hemorrhoids 4. Advise EGD and colonoscopy for luminal evaluation.  5. Plan for EGD and colonoscopy tomorrow morning with Dr. Alice Reichert. If EGD and colonoscopy are negative, consider video capsule endoscopy to assess for small bowel bleeding 6. Clear liquid diet today. NPO after midnight. Bowel prep 1700 today.   I reviewed the risks (including bleeding, perforation, infection, anesthesia complications, cardiac/respiratory complications), benefits and alternatives of EGD and colonsocopy. Patient consents to proceed.  Thank you for the consult. Please call with questions or concerns.  [facility] [phone] (Cell)

## 2020-05-18 NOTE — ED Notes (Signed)
Date and time results received: 05/18/20 01:51 (use smartphrase ".now" to insert current time)  Test: hgb  Critical Value: 4.9  Name of Provider Notified: Dr. Cherylann Banas  Orders Received? Or Actions Taken?: acknowledged

## 2020-05-18 NOTE — H&P (Signed)
[place] [facility] [place] NAME: Douglas Patton    MR#:  [id number]  DATE OF BIRTH:  [birth year]  DATE OF ADMISSION:  05/18/2020  PRIMARY CARE PHYSICIAN: Patient, No Pcp Per   REQUESTING/REFERRING PHYSICIAN: Arta Silence, MD  CHIEF COMPLAINT:   Chief Complaint  Patient presents with  . Abdominal Pain  . Rectal Bleeding    HISTORY OF PRESENT ILLNESS:  Douglas Patton  is a 34 y.o. African-American male with a known history of tobacco abuse and anemia, as well as internal hemorrhoids, who presented to the emergency room with acute onset of dark stools for the last few days and was noted to have positive stool Hemoccult in the ER without clear melanotic stools.  He denied any heartburn but admitted to nausea without vomiting or diarrhea.  He admitted to fatigue and tiredness lately.  He has been having mild left-sided lower quadrant pain.  Denied any chest pain or palpitations.  No dysuria, oliguria or hematuria or flank pain.  Upon presentation to the emergency room, blood pressure was 152/67 and later 136/66 with heart rate of 100 and otherwise normal vital signs.  Labs revealed unremarkable CMP.  CBC showed severe anemia with hemoglobin of 4.9 hematocrit 17.8 compared to 7.5 and 26.1 on 10/16/2019.  8 also showed low RBC indices indicating microcytic hypochromic anemia..  The patient was given a bolus of IV Protonix 80 mg followed by infusion.  He will be admitted to the medical monitored bed for further evaluation and management. PAST MEDICAL HISTORY:   Past Medical History:  Diagnosis Date  . Anemia   . Bronchitis   . Tobacco abuse     PAST SURGICAL HISTORY:   Past Surgical History:  Procedure Laterality Date  . COLONOSCOPY N/A 03/04/2018   Procedure: COLONOSCOPY;  Surgeon: Lin Landsman, MD;  Location: [facility] ENDOSCOPY;  Service: Gastroenterology;  Laterality: N/A;  . ESOPHAGOGASTRODUODENOSCOPY N/A 03/04/2018   Procedure:  ESOPHAGOGASTRODUODENOSCOPY (EGD);  Surgeon: Lin Landsman, MD;  Location[facility] ENDOSCOPY;  Service: Gastroenterology;  Laterality: N/A;  . NO PAST SURGERIES      SOCIAL HISTORY:   Social History   Tobacco Use  . Smoking status: Current Every Day Smoker  . Smokeless tobacco: Never Used  Substance Use Topics  . Alcohol use: Not Currently    FAMILY HISTORY:   Family History  Problem Relation Age of Onset  . Diabetes Mellitus II Mother   . CVA Father     DRUG ALLERGIES:  No Known Allergies  REVIEW OF SYSTEMS:   ROS As per history of present illness. All pertinent systems were reviewed above. Constitutional,  HEENT, cardiovascular, respiratory, GI, GU, musculoskeletal, neuro, psychiatric, endocrine,  integumentary and hematologic systems were reviewed and are otherwise  negative/unremarkable except for positive findings mentioned above in the HPI.   MEDICATIONS AT HOME:   Prior to Admission medications   Medication Sig Start Date End Date Taking? Authorizing Provider  iron polysaccharides (NIFEREX) 150 MG capsule Take 1 capsule (150 mg total) by mouth daily. 03/04/18   Fritzi Mandes, MD  prochlorperazine (COMPAZINE) 10 MG tablet Take 1 tablet (10 mg total) by mouth every 6 (six) hours as needed for nausea or vomiting. 08/06/18   Merlyn Lot, MD      VITAL SIGNS:  Blood pressure 128/67, pulse 71, temperature 98.2 F (36.8 C), temperature source Oral, resp. rate 12, height 5\' 11"  (1.803 m), weight 90.7 kg, SpO2 100 %.  PHYSICAL EXAMINATION:  Physical Exam  GENERAL:  34 y.o.-year-old patient lying in the bed with no acute distress.  EYES: Pupils equal, round, reactive to light and accommodation.  Positive pallor.  No scleral icterus. Extraocular muscles intact.  HEENT: Head atraumatic, normocephalic. Oropharynx and nasopharynx clear.  NECK:  Supple, no jugular venous distention. No thyroid enlargement, no tenderness.  LUNGS: Normal breath sounds bilaterally, no  wheezing, rales,rhonchi or crepitation. No use of accessory muscles of respiration.  CARDIOVASCULAR: Regular rate and rhythm, S1, S2 normal. No murmurs, rubs, or gallops.  ABDOMEN: Soft, nondistended, nontender. Bowel sounds present. No organomegaly or mass.  EXTREMITIES: No pedal edema, cyanosis, or clubbing.  NEUROLOGIC: Cranial nerves II through XII are intact. Muscle strength 5/5 in all extremities. Sensation intact. Gait not checked.  PSYCHIATRIC: The patient is alert and oriented x 3.  Normal affect and good eye contact. SKIN: No obvious rash, lesion, or ulcer.   LABORATORY PANEL:   CBC Recent Labs  Lab 05/18/20 0118  WBC 5.7  HGB 4.9*  HCT 17.8*  PLT 246   ------------------------------------------------------------------------------------------------------------------  Chemistries  Recent Labs  Lab 05/18/20 0118  NA 136  K 4.1  CL 103  CO2 24  GLUCOSE 111*  BUN 15  CREATININE 1.04  CALCIUM 8.7*  AST 21  ALT 13  ALKPHOS 55  BILITOT 0.5   ------------------------------------------------------------------------------------------------------------------  Cardiac Enzymes No results for input(s): TROPONINI in the last 168 hours. ------------------------------------------------------------------------------------------------------------------  RADIOLOGY:  No results found.    IMPRESSION AND PLAN:   1.  GI bleeding with subsequent acute blood loss anemia on chronic anemia.  It is unclear if this is of upper or lower GI etiology. -The patient will be admitted to a medical monitored bed. -She was typed and crossmatch will be transfused 2 units of packed red blood cells. -We will hydrate with IV normal saline. -We will follow serial hemoglobins and hematocrits. -We will continue IV PPI infusion with Protonix. -GI consultation will be obtained.  I notified Dr. Alice Reichert about the patient.  2.  Tobacco abuse. -He will be counseled for smoking cessation.  3.  DVT  prophylaxis. -SCDs. -Medical prophylaxis currently contraindicated due to GI bleeding.    All the records are reviewed and case discussed with ED provider. The plan of care was discussed in details with the patient (and family). I answered all questions. The patient agreed to proceed with the above mentioned plan. Further management will depend upon hospital course.   CODE STATUS: Full code  Status is: Inpatient  Remains inpatient appropriate because:Ongoing active pain requiring inpatient pain management, Altered mental status, Ongoing diagnostic testing needed not appropriate for outpatient work up, Unsafe d/c plan, IV treatments appropriate due to intensity of illness or inability to take PO and Inpatient level of care appropriate due to severity of illness   Dispo: The patient is from: Home              Anticipated d/c is to: Home              Anticipated d/c date is: 2 days              Patient currently is not medically stable to d/c.    TOTAL TIME TAKING CARE OF THIS PATIENT: 55 minutes.    Christel Mormon M.D on 05/18/2020 at 4:12 AM  [facility]   From 7 PM-7 AM, contact night-coverage [email or web address]  CC: Primary care physician; Patient, No Pcp Per   Note: This dictation was prepared with Dragon dictation along  with smaller phrase technology. Any transcriptional errors that result from this process are unintentional.

## 2020-05-18 NOTE — ED Notes (Signed)
Attempted to call report at this time- RN busy per unit. Extension provided for call-back.

## 2020-05-19 ENCOUNTER — Inpatient Hospital Stay: Payer: Self-pay | Admitting: Anesthesiology

## 2020-05-19 ENCOUNTER — Encounter: Payer: Self-pay | Admitting: Family Medicine

## 2020-05-19 ENCOUNTER — Encounter
Admission: EM | Disposition: A | Discharge status: Home / Self Care | Payer: Self-pay | Source: Home / Self Care | Attending: Internal Medicine

## 2020-05-19 HISTORY — PX: ESOPHAGOGASTRODUODENOSCOPY: SHX5428

## 2020-05-19 HISTORY — PX: COLONOSCOPY: SHX5424

## 2020-05-19 LAB — CBC
HCT: 23.5 % — ABNORMAL LOW (ref 39.0–52.0)
Hemoglobin: 7 g/dL — ABNORMAL LOW (ref 13.0–17.0)
MCH: 20.4 pg — ABNORMAL LOW (ref 26.0–34.0)
MCHC: 29.8 g/dL — ABNORMAL LOW (ref 30.0–36.0)
MCV: 68.5 fL — ABNORMAL LOW (ref 80.0–100.0)
Platelets: 179 10*3/uL (ref 150–400)
RBC: 3.43 MIL/uL — ABNORMAL LOW (ref 4.22–5.81)
RDW: 23.2 % — ABNORMAL HIGH (ref 11.5–15.5)
WBC: 4.8 10*3/uL (ref 4.0–10.5)
nRBC: 0 % (ref 0.0–0.2)

## 2020-05-19 LAB — TYPE AND SCREEN
ABO/RH(D): O POS
Antibody Screen: NEGATIVE
Unit division: 0
Unit division: 0
Unit division: 0

## 2020-05-19 LAB — BPAM RBC
Blood Product Expiration Date: 202106242359
Blood Product Expiration Date: 202106252359
Blood Product Expiration Date: 202106252359
ISSUE DATE / TIME: 202105220231
ISSUE DATE / TIME: 202105220231
ISSUE DATE / TIME: 202105220923
Unit Type and Rh: 5100
Unit Type and Rh: 5100
Unit Type and Rh: 5100

## 2020-05-19 LAB — IRON AND TIBC
Iron: 9 ug/dL — ABNORMAL LOW (ref 45–182)
Saturation Ratios: 2 % — ABNORMAL LOW (ref 17.9–39.5)
TIBC: 382 ug/dL (ref 250–450)
UIBC: 373 ug/dL

## 2020-05-19 LAB — BASIC METABOLIC PANEL
Anion gap: 6 (ref 5–15)
BUN: 8 mg/dL (ref 6–20)
CO2: 24 mmol/L (ref 22–32)
Calcium: 8.3 mg/dL — ABNORMAL LOW (ref 8.9–10.3)
Chloride: 108 mmol/L (ref 98–111)
Creatinine, Ser: 1.02 mg/dL (ref 0.61–1.24)
GFR calc Af Amer: >60 mL/min (ref >60)
GFR calc non Af Amer: >60 mL/min (ref >60)
Glucose, Bld: 88 mg/dL (ref 70–99)
Potassium: 3.8 mmol/L (ref 3.5–5.1)
Sodium: 138 mmol/L (ref 135–145)

## 2020-05-19 LAB — FERRITIN: Ferritin: 3 ng/mL — ABNORMAL LOW (ref 24–336)

## 2020-05-19 LAB — VITAMIN B12: Vitamin B-12: 147 pg/mL — ABNORMAL LOW (ref 180–914)

## 2020-05-19 SURGERY — EGD (ESOPHAGOGASTRODUODENOSCOPY)
Anesthesia: General

## 2020-05-19 MED ORDER — LIDOCAINE HCL (CARDIAC) PF 100 MG/5ML IV SOSY
PREFILLED_SYRINGE | INTRAVENOUS | Status: DC | PRN
  Administered 2020-05-19: 160 mg via INTRAVENOUS

## 2020-05-19 MED ORDER — FOLIC ACID 1 MG PO TABS
1.0000 mg | ORAL_TABLET | Freq: Every day | ORAL | 0 refills | Status: AC
Start: 2020-05-19 — End: 2021-05-19

## 2020-05-19 MED ORDER — PROPOFOL 500 MG/50ML IV EMUL
INTRAVENOUS | Status: AC
  Filled 2020-05-19: qty 50

## 2020-05-19 MED ORDER — B COMPLEX-C PO TABS
1.0000 | ORAL_TABLET | Freq: Every day | ORAL | 0 refills | Status: DC
Start: 2020-05-19 — End: 2020-05-19

## 2020-05-19 MED ORDER — VITAMIN B-12 1000 MCG PO TABS
1000.0000 ug | ORAL_TABLET | Freq: Every day | ORAL | 0 refills | Status: DC
Start: 2020-05-19 — End: 2022-09-03

## 2020-05-19 MED ORDER — POLYSACCHARIDE IRON COMPLEX 150 MG PO CAPS: 150.0000 mg | ORAL_CAPSULE | Freq: Two times a day (BID) | ORAL | 0 refills | Status: DC

## 2020-05-19 MED ORDER — SODIUM CHLORIDE 0.9 % IV SOLN
510.0000 mg | Freq: Once | INTRAVENOUS | Status: AC
  Administered 2020-05-19: 510 mg via INTRAVENOUS
  Filled 2020-05-19: qty 17

## 2020-05-19 MED ORDER — CYANOCOBALAMIN 1000 MCG/ML IJ SOLN
1000.0000 ug | Freq: Once | INTRAMUSCULAR | Status: AC
  Administered 2020-05-19: 1000 ug via SUBCUTANEOUS
  Filled 2020-05-19: qty 1

## 2020-05-19 MED ORDER — LIDOCAINE HCL (PF) 2 % IJ SOLN
INTRAMUSCULAR | Status: AC
  Filled 2020-05-19: qty 10

## 2020-05-19 MED ORDER — PROPOFOL 500 MG/50ML IV EMUL
INTRAVENOUS | Status: DC | PRN
  Administered 2020-05-19: 200 ug/kg/min via INTRAVENOUS
  Administered 2020-05-19: 30 mg via INTRAVENOUS
  Administered 2020-05-19: 20 mg via INTRAVENOUS
  Administered 2020-05-19: 40 mg via INTRAVENOUS
  Administered 2020-05-19: 200 mg via INTRAVENOUS

## 2020-05-19 MED ORDER — PANTOPRAZOLE SODIUM 40 MG PO TBEC
40.0000 mg | DELAYED_RELEASE_TABLET | Freq: Two times a day (BID) | ORAL | 0 refills | Status: DC
Start: 2020-05-19 — End: 2022-09-03

## 2020-05-19 NOTE — Interval H&P Note (Signed)
History and Physical Interval Note:  05/19/2020 10:11 AM  Douglas Patton  has presented today for surgery, with the diagnosis of Acute blood loss anemia, hematochezia, IDA, heme positive stool.  The various methods of treatment have been discussed with the patient and family. After consideration of risks, benefits and other options for treatment, the patient has consented to  Procedure(s): ESOPHAGOGASTRODUODENOSCOPY (EGD) (N/A) COLONOSCOPY (N/A) as a surgical intervention.  The patient's history has been reviewed, patient examined, no change in status, stable for surgery.  I have reviewed the patient's chart and labs.  Questions were answered to the patient's satisfaction.     [place], [place]

## 2020-05-19 NOTE — Progress Notes (Signed)
Order received to discharge the patient. IV's removed. Discharge instructions reviewed with the patient and his fiance. Instructions and note for work given to the patient. Patient sent out via wheelchair to his ride.

## 2020-05-19 NOTE — Discharge Instructions (Signed)
Gastrointestinal Bleeding Gastrointestinal (GI) bleeding is bleeding somewhere along the digestive tract, between the mouth and the anus. This tract includes the mouth, esophagus, stomach, small intestine, large intestine, and anus. The large intestine is often called the colon. GI bleeding can be caused by various problems. The severity of these problems can range from mild to serious or even life-threatening. If you have GI bleeding, you may find blood in your stools (feces), you may have black stools, or you may vomit blood. If there is a lot of bleeding, you may need to stay in the hospital. What are the causes? This condition may be caused by:  Inflammation, irritation, or swelling of the esophagus (esophagitis). The esophagus is part of the body that moves food from your mouth to your stomach.  Swollen veins in the rectum (hemorrhoids).  Areas of painful tearing in the anus that are often caused by passing hard stool (anal fissures).  Pouches that form on the colon over time, with age, and may bleed a lot (diverticulosis).  Inflammation (diverticulitis) in areas with diverticulosis. This can cause pain, fever, and bloody stools, although bleeding may be mild.  Growths (polyps) or cancer. Colon cancer often starts out as precancerous polyps.  Gastritis and ulcers. With these, bleeding may come from the upper GI tract, near the stomach. What increases the risk? You are more likely to develop this condition if you:  Have an infection in your stomach from a type of bacteria called Helicobacter pylori.  Take certain medicines, such as: ? NSAIDs. ? Aspirin. ? Selective serotonin reuptake inhibitors (SSRIs). ? Steroids. ? Antiplatelet or anticoagulant medicines.  Smoke.  Drink alcohol. What are the signs or symptoms? Common symptoms of this condition include:  Bright red blood in your vomit, or vomit that looks like coffee grounds.  Bloody, black, or tarry stools. ? Bleeding  from the lower GI tract will usually cause red or maroon blood in the stools. ? Bleeding from the upper GI tract may cause black, tarry stools that are often stronger smelling than usual. ? In certain cases, if the bleeding is fast enough, the stools may be red.  Pain or cramping in the abdomen. How is this diagnosed? This condition may be diagnosed based on:  Your medical history and a physical exam.  Various tests, such as: ? Blood tests. ? Stool tests. ? X-rays and other imaging tests. ? Esophagogastroduodenoscopy (EGD). In this test, a flexible, lighted tube is used to look at your esophagus, stomach, and small intestine. ? Colonoscopy. In this test, a flexible, lighted tube is used to look at your colon. How is this treated? Treatment for this condition depends on the cause of the bleeding. For example:  For bleeding from the esophagus, stomach, small intestine, or colon, the health care provider doing your EGD or colonoscopy may be able to stop the bleeding as part of the procedure.  Inflammation or infection of the colon can be treated with medicines.  Certain rectal problems can be treated with creams, suppositories, or warm baths.  Medicines may be given to reduce acid in your stomach.  Surgery is sometimes needed.  Blood transfusions are sometimes needed if a lot of blood has been lost. If bleeding is mild, you may be allowed to go home. If there is a lot of bleeding, you will need to stay in the hospital for observation. Follow these instructions at home:   Take over-the-counter and prescription medicines only as told by your health care provider.    Eat foods that are high in fiber, such as beans, whole grains, and fresh fruits and vegetables. This will help to keep your stools soft. Eating 1-3 prunes each day works well for many people.  Drink enough fluid to keep your urine pale yellow.  Keep all follow-up visits as told by your health care provider. This is  important. Contact a health care provider if:  Your symptoms do not improve. Get help right away if:  Your bleeding does not stop.  You feel light-headed or you faint.  You feel weak.  You have severe cramps in your back or abdomen.  You pass large blood clots in your stool.  Your symptoms are getting worse.  You have chest pain or fast heartbeats. Summary  Gastrointestinal (GI) bleeding is bleeding somewhere along the digestive tract, between the mouth and anus. GI bleeding can be caused by various problems. The severity of these problems can range from mild to serious or even life-threatening.  Treatment for this condition depends on the cause of the bleeding.  Take over-the-counter and prescription medicines only as told by your health care provider.  Keep all follow-up visits as told by your health care provider. This is important.  Get help right away if your bleeding increases, your symptoms are getting worse, or you have new symptoms. This information is not intended to replace advice given to you by your health care provider. Make sure you discuss any questions you have with your health care provider. Document Revised: 07/27/2018 Document Reviewed: 07/27/2018 Elsevier Patient Education  2020 [employer]  

## 2020-05-19 NOTE — Anesthesia Preprocedure Evaluation (Signed)
Anesthesia Evaluation  Patient identified by MRN, date of birth, ID band Patient awake    Reviewed: Allergy & Precautions, H&P , NPO status , Patient's Chart, lab work & pertinent test results, reviewed documented beta blocker date and time   History of Anesthesia Complications Negative for: history of anesthetic complications  Airway Mallampati: I  TM Distance: >3 FB Neck ROM: full    Dental no notable dental hx. (+) Dental Advidsory Given   Pulmonary neg shortness of breath, asthma , neg sleep apnea, neg COPD, neg recent URI, Current Smoker,           Cardiovascular Exercise Tolerance: Good negative cardio ROS       Neuro/Psych negative neurological ROS  negative psych ROS   GI/Hepatic Neg liver ROS, GERD  ,  Endo/Other  negative endocrine ROS  Renal/GU negative Renal ROS  negative genitourinary   Musculoskeletal   Abdominal   Peds  Hematology  (+) Blood dyscrasia, anemia ,   Anesthesia Other Findings Past Medical History: No date: Bronchitis No date: Tobacco abuse   Reproductive/Obstetrics negative OB ROS                             Anesthesia Physical  Anesthesia Plan  ASA: II  Anesthesia Plan: General   Post-op Pain Management:    Induction: Intravenous  PONV Risk Score and Plan: 1 and Propofol infusion and TIVA  Airway Management Planned: Nasal Cannula  Additional Equipment:   Intra-op Plan:   Post-operative Plan:   Informed Consent: I have reviewed the patients History and Physical, chart, labs and discussed the procedure including the risks, benefits and alternatives for the proposed anesthesia with the patient or authorized representative who has indicated his/her understanding and acceptance.     Dental Advisory Given  Plan Discussed with: Anesthesiologist, CRNA and Surgeon  Anesthesia Plan Comments:         Anesthesia Quick Evaluation

## 2020-05-19 NOTE — Transfer of Care (Signed)
Immediate Anesthesia Transfer of Care Note  Patient: Douglas Patton  Procedure(s) Performed: ESOPHAGOGASTRODUODENOSCOPY (EGD) (N/A ) COLONOSCOPY (N/A )  Patient Location: Endoscopy Unit  Anesthesia Type:MAC  Level of Consciousness: drowsy and responds to stimulation  Airway & Oxygen Therapy: Patient Spontanous Breathing and Patient connected to nasal cannula oxygen  Post-op Assessment: Report given to RN and Post -op Vital signs reviewed and stable  Post vital signs: Reviewed and stable  Last Vitals:  Vitals Value Taken Time  BP    Temp    Pulse    Resp    SpO2      Last Pain:  Vitals:   05/19/20 0944  TempSrc: Temporal  PainSc: 0-No pain         Complications: No apparent anesthesia complications

## 2020-05-19 NOTE — Op Note (Signed)
[facility] Gastroenterology Patient Name: Erle Mantooth Procedure Date: 05/19/2020 9:25 AM MRN: [id number] Account #: [id number] Date of Birth: 15-Feb-1986 Admit Type: Inpatient Age: 34 Room: [facility] ENDO ROOM 4 Gender: Male Note Status: Finalized Procedure:             Colonoscopy Indications:           Hematochezia, Acute post hemorrhagic anemia Providers:             Benay Pike. [place] MD, MD Referring MD:          none Medicines:             Propofol per Anesthesia Complications:         No immediate complications. Estimated blood loss: None. Procedure:             Pre-Anesthesia Assessment:                        - The risks and benefits of the procedure and the                         sedation options and risks were discussed with the                         patient. All questions were answered and informed                         consent was obtained.                        - Patient identification and proposed procedure were                         verified prior to the procedure by the nurse. The                         procedure was verified in the procedure room.                        - ASA Grade Assessment: II - A patient with mild                         systemic disease.                        - After reviewing the risks and benefits, the patient                         was deemed in satisfactory condition to undergo the                         procedure.                        After obtaining informed consent, the colonoscope was                         passed under direct vision. Throughout the procedure,                         the patient's blood pressure, pulse,  and oxygen                         saturations were monitored continuously. The                         Colonoscope was introduced through the anus and                         advanced to the the terminal ileum, with                         identification of the appendiceal orifice and  IC                         valve. The colonoscopy was technically difficult and                         complex due to difficult intubation of terminal ileum.                         Successful completion of the procedure was aided by                         withdrawing and reinserting the scope. The patient                         tolerated the procedure well. The quality of the bowel                         preparation was excellent. Findings:      The perianal and digital rectal examinations were normal. Pertinent       negatives include normal sphincter tone.      Two sessile polyps were found in the rectum. The polyps were diminutive       in size. These polyps were removed with a cold biopsy forceps. Resection       and retrieval were complete.      One filamentous, solitary pseudopolyp was found at the hepatic flexure.       This was biopsied with a cold forceps for histology.      The terminal ileum appeared normal. Biopsies were taken with a cold       forceps for histology.      Normal mucosa was found in the left colon and in the right colon.       Biopsies for histology were taken with a cold forceps from the right       colon and left colon for evaluation of microscopic colitis.      A localized area of mildly erythematous mucosa was found in the distal       rectum. This was biopsied with a cold forceps for histology.      Non-bleeding non-thrombosed internal hemorrhoids were found during       retroflexion. The hemorrhoids were Grade III (internal hemorrhoids that       prolapse but require manual reduction). Estimated blood loss: none.      The exam was otherwise without abnormality. Impression:            - Two diminutive polyps in the rectum, removed with a  cold biopsy forceps. Resected and retrieved.                        - Pseudopolyp x 1 at the hepatic flexure. Biopsied.                        - The examined portion of the ileum was normal.                          Biopsied.                        - Normal mucosa in the left colon and in the right                         colon. Biopsied.                        - Erythematous mucosa in the distal rectum. Biopsied.                        - Non-bleeding non-thrombosed internal hemorrhoids.                        - The examination was otherwise normal.                        - Though hemorrhoids are a noted culprit of lower GI                         bleeding, patient describes tenesmus and fecal urgency                         along with some diarrhea that is suspicious for                         inflammatory bowel disease.                        He will require a small bowel workup as a result in                         the form of small bowel capsule and/or CT enterography                         in the near future. Recommendation:        - Patient has a contact number available for                         emergencies. The signs and symptoms of potential                         delayed complications were discussed with the patient.                         Return to normal activities tomorrow. Written                         discharge instructions were provided to the  patient.                        - Advance diet as tolerated.                        - No aspirin, ibuprofen, naproxen, or other                         non-steroidal anti-inflammatory drugs INDEFINITELY.                        - Return to my office.                        - Await pathology results.                        - Return patient to hospital ward for possible                         discharge same day. Procedure Code(s):     --- Professional ---                        [phone], Colonoscopy, flexible; with biopsy, single or                         multiple Diagnosis Code(s):     --- Professional ---                        D62, Acute posthemorrhagic anemia                        K92.1, Melena (includes  Hematochezia)                        K62.89, Other specified diseases of anus and rectum                        K64.2, Third degree hemorrhoids                        K51.40, Inflammatory polyps of colon without                         complications                        K62.1, Rectal polyp CPT copyright 2019 American Medical Association. All rights reserved. The codes documented in this report are preliminary and upon coder review may  be revised to meet current compliance requirements. Efrain Sella MD, MD 05/19/2020 11:21:44 AM This report has been signed electronically. Number of Addenda: 0 Note Initiated On: 05/19/2020 9:25 AM Scope Withdrawal Time: 0 hours 21 minutes 12 seconds  Total Procedure Duration: 0 hours 29 minutes 42 seconds  Estimated Blood Loss:  Estimated blood loss: none. Estimated blood loss: none.      [facility]

## 2020-05-19 NOTE — Op Note (Signed)
[facility] Gastroenterology Patient Name: Douglas Patton Procedure Date: 05/19/2020 9:34 AM MRN: [id number] Account #: [id number] Date of Birth: 1986/11/23 Admit Type: Inpatient Age: 34 Room: [facility] ENDO ROOM 4 Gender: Male Note Status: Finalized Procedure:             Upper GI endoscopy Indications:           Acute post hemorrhagic anemia, Hematochezia Providers:             Benay Pike. Ericia Moxley MD, MD Referring MD:          none Medicines:             Propofol per Anesthesia Complications:         No immediate complications. Procedure:             Pre-Anesthesia Assessment:                        - The risks and benefits of the procedure and the                         sedation options and risks were discussed with the                         patient. All questions were answered and informed                         consent was obtained.                        - Patient identification and proposed procedure were                         verified prior to the procedure by the nurse. The                         procedure was verified in the procedure room.                        - ASA Grade Assessment: II - A patient with mild                         systemic disease.                        - After reviewing the risks and benefits, the patient                         was deemed in satisfactory condition to undergo the                         procedure.                        After obtaining informed consent, the endoscope was                         passed under direct vision. Throughout the procedure,                         the patient's blood pressure, pulse, and oxygen  saturations were monitored continuously. The Endoscope                         was introduced through the mouth, and advanced to the                         third part of duodenum. The upper GI endoscopy was                         accomplished without difficulty. The  patient tolerated                         the procedure well. Findings:      The esophagus was normal.      The entire examined stomach was normal. Biopsies were taken with a cold       forceps for Helicobacter pylori testing.      The cardia and gastric fundus were normal on retroflexion.      Localized mildly erythematous mucosa without active bleeding and with no       stigmata of bleeding was found in the first portion of the duodenum.       Biopsies were taken with a cold forceps for histology.      The second portion of the duodenum and third portion of the duodenum       were normal.      The exam was otherwise without abnormality. Impression:            - Normal esophagus.                        - Normal stomach. Biopsied.                        - Erythematous duodenopathy. Biopsied.                        - Normal second portion of the duodenum and third                         portion of the duodenum.                        - The examination was otherwise normal. Recommendation:        - Await pathology results.                        - Proceed with colonoscopy Procedure Code(s):     --- Professional ---                        [phone], Esophagogastroduodenoscopy, flexible,                         transoral; with biopsy, single or multiple Diagnosis Code(s):     --- Professional ---                        K92.1, Melena (includes Hematochezia)                        D62, Acute posthemorrhagic anemia  K31.89, Other diseases of stomach and duodenum CPT copyright 2019 American Medical Association. All rights reserved. The codes documented in this report are preliminary and upon coder review may  be revised to meet current compliance requirements. Efrain Sella MD, MD 05/19/2020 10:30:12 AM This report has been signed electronically. Number of Addenda: 0 Note Initiated On: 05/19/2020 9:34 AM Estimated Blood Loss:  Estimated blood loss: none.      [facility]

## 2020-05-19 NOTE — Anesthesia Postprocedure Evaluation (Signed)
Anesthesia Post Note  Patient: KENSEN TIERRABLANCA  Procedure(s) Performed: ESOPHAGOGASTRODUODENOSCOPY (EGD) (N/A ) COLONOSCOPY (N/A )  Patient location during evaluation: Endoscopy Anesthesia Type: General Level of consciousness: awake and alert Pain management: pain level controlled Vital Signs Assessment: post-procedure vital signs reviewed and stable Respiratory status: spontaneous breathing, nonlabored ventilation, respiratory function stable and patient connected to nasal cannula oxygen Cardiovascular status: blood pressure returned to baseline and stable Postop Assessment: no apparent nausea or vomiting Anesthetic complications: no     Last Vitals:  Vitals:   05/19/20 1149 05/19/20 1159  BP: 113/62 (!) 106/52  Pulse: 60 74  Resp: 14 15  Temp:    SpO2: 99% 100%    Last Pain:  Vitals:   05/19/20 1159  TempSrc:   PainSc: 0-No pain                 Martha Clan

## 2020-05-19 NOTE — Progress Notes (Signed)
Patient returned from endoscopy and colonoscopy. Felling bloated

## 2020-05-20 ENCOUNTER — Encounter: Payer: Self-pay | Admitting: *Deleted

## 2020-05-21 NOTE — Discharge Summary (Signed)
[facility] Discharge Summary   Patient: Douglas Patton [id number]  PCP: Patient, No Pcp Per  Date of admission: 05/18/2020   Date of discharge: 05/19/2020      Discharge Diagnoses:  Principal diagnosis Symptomatic anemia from chronic GI bleed Active Problems:   GI bleeding Suspected inflammatory bowel syndrome  Admitted From: Home Disposition:  Home   Recommendations for Outpatient Follow-up:  1. PCP: Follow-up with PCP and GI as recommended 2. Follow up LABS/TEST: None  Follow-up Information    Efrain Sella, MD. Schedule an appointment as soon as possible for a visit in 1 month(s).   Specialty: Gastroenterology Contact information: [address] [place] [address] [phone]          Diet recommendation: Regular diet  Activity: The patient is advised to gradually reintroduce usual activities, as tolerated  Discharge Condition: stable  Code Status: Full code   History of present illness: As per the H and P dictated on admission, "Douglas Patton  is a 34 y.o. African-American male with a known history of tobacco abuse and anemia, as well as internal hemorrhoids, who presented to the emergency room with acute onset of dark stools for the last few days and was noted to have positive stool Hemoccult in the ER without clear melanotic stools.  He denied any heartburn but admitted to nausea without vomiting or diarrhea.  He admitted to fatigue and tiredness lately.  He has been having mild left-sided lower quadrant pain.  Denied any chest pain or palpitations.  No dysuria, oliguria or hematuria or flank pain.  Upon presentation to the emergency room, blood pressure was 152/67 and later 136/66 with heart rate of 100 and otherwise normal vital signs.  Labs revealed unremarkable CMP.  CBC showed severe anemia with hemoglobin of 4.9 hematocrit 17.8 compared to 7.5 and 26.1 on 10/16/2019.  8 also showed low RBC indices indicating microcytic hypochromic  anemia..  The patient was given a bolus of IV Protonix 80 mg followed by infusion.  He will be admitted to the medical monitored bed for further evaluation and management."  Hospital Course:  Summary of his active problems in the hospital is as following. Symptomatic anemia. Chronic recurrent GI bleed. Abdominal pain. Suspected inflammatory bowel syndrome. Presents with abdominal pain as well as anemia. SP PRBC transfusion. GI was consulted. Underwent EGD colonoscopy. EGD was negative for any acute bleeding. GI will work-up further outpatient for inflammatory bowel syndrome. Continuing oral iron. Patient was given IV iron in the hospital.  Active smoker. Patient was given counseling to quit smoking.   Patient was ambulatory without any assistance. On the day of the discharge the patient's vitals were stable, and no other acute medical condition were reported by patient. the patient was felt safe to be discharge at Home with no therapy needed on discharge.  Consultants: Gastroenterology  Procedures: Upper GI endoscopy and colonoscopy  Discharge Exam: General: Appear in no distress, no Rash; Oral Mucosa Clear, moist. Cardiovascular: S1 and S2 Present, no Murmur, Respiratory: normal respiratory effort, Bilateral Air entry present and no Crackles, no wheezes Abdomen: Bowel Sound present, Soft and no tenderness, no hernia Extremities: no Pedal edema, no calf tenderness Neurology: alert and oriented to time, place, and person affect appropriate.  Filed Weights   05/18/20 0111 05/18/20 2106  Weight: 90.7 kg 84 kg   Vitals:   05/19/20 1159 05/19/20 1250  BP: (!) 106/52 (!) 146/88  Pulse: 74 (!) 52  Resp: 15 18  Temp:  98.1 F (36.7 C)  SpO2: 100% 100%    DISCHARGE MEDICATION: Allergies as of 05/19/2020   No Known Allergies     Medication List    TAKE these medications   folic acid 1 MG tablet Commonly known as: FOLVITE Take 1 tablet (1 mg total) by mouth daily.    iron polysaccharides 150 MG capsule Commonly known as: NIFEREX Take 1 capsule (150 mg total) by mouth 2 (two) times daily. What changed: when to take this   pantoprazole 40 MG tablet Commonly known as: Protonix Take 1 tablet (40 mg total) by mouth 2 (two) times daily before a meal for 14 days.   prochlorperazine 10 MG tablet Commonly known as: COMPAZINE Take 1 tablet (10 mg total) by mouth every 6 (six) hours as needed for nausea or vomiting.   vitamin B-12 1000 MCG tablet Commonly known as: CYANOCOBALAMIN Take 1 tablet (1,000 mcg total) by mouth daily.      No Known Allergies Discharge Instructions    Diet - low sodium heart healthy   Complete by: As directed    Increase activity slowly   Complete by: As directed       The results of significant diagnostics from this hospitalization (including imaging, microbiology, ancillary and laboratory) are listed below for reference.    Significant Diagnostic Studies: DG Abd 2 Views  Result Date: 05/18/2020 CLINICAL DATA:  History of GI bleeding EXAM: ABDOMEN - 2 VIEW COMPARISON:  None. FINDINGS: Scattered large and small bowel gas is noted. No obstructive changes are seen. No free air is noted. No bony abnormality is seen. No abnormal mass is noted. IMPRESSION: No acute abnormality noted. Electronically Signed   By: Inez Catalina M.D.   On: 05/18/2020 09:21    Microbiology: Recent Results (from the past 240 hour(s))  SARS Coronavirus 2 by RT PCR (hospital order, performed in [facility] hospital lab) Nasopharyngeal Nasopharyngeal Swab     Status: None   Collection Time: 05/18/20  3:03 AM   Specimen: Nasopharyngeal Swab  Result Value Ref Range Status   SARS Coronavirus 2 NEGATIVE NEGATIVE Final    Comment: (NOTE) SARS-CoV-2 target nucleic acids are NOT DETECTED. The SARS-CoV-2 RNA is generally detectable in upper and lower respiratory specimens during the acute phase of infection. The lowest concentration of SARS-CoV-2 viral  copies this assay can detect is 250 copies / mL. A negative result does not preclude SARS-CoV-2 infection and should not be used as the sole basis for treatment or other patient management decisions.  A negative result may occur with improper specimen collection / handling, submission of specimen other than nasopharyngeal swab, presence of viral mutation(s) within the areas targeted by this assay, and inadequate number of viral copies (<250 copies / mL). A negative result must be combined with clinical observations, patient history, and epidemiological information. Fact Sheet for Patients:   StrictlyIdeas.no Fact Sheet for Healthcare Providers: BankingDealers.co.za This test is not yet approved or cleared  by the [place] FDA and has been authorized for detection and/or diagnosis of SARS-CoV-2 by FDA under an Emergency Use Authorization (EUA).  This EUA will remain in effect (meaning this test can be used) for the duration of the COVID-19 declaration under Section 564(b)(1) of the Act, 21 U.S.C. section 360bbb-3(b)(1), unless the authorization is terminated or revoked sooner. Performed at [facility], [address] [place], [place] [address]      Labs: CBC: Recent Labs  Lab 05/18/20 0118 05/18/20 0533 05/18/20 1400 05/19/20 0444  WBC 5.7 5.4 4.1 4.8  HGB 4.9* 6.1* 7.4* 7.0*  HCT 17.8* 20.3* 24.0* 23.5*  MCV 60.8* 64.9* 67.4* 68.5*  PLT 246 188 182 0000000   Basic Metabolic Panel: Recent Labs  Lab 05/18/20 0118 05/18/20 0533 05/19/20 0444  NA 136 136 138  K 4.1 3.9 3.8  CL 103 105 108  CO2 24 24 24   GLUCOSE 111* 90 88  BUN 15 13 8   CREATININE 1.04 1.09 1.02  CALCIUM 8.7* 8.2* 8.3*   Liver Function Tests: Recent Labs  Lab 05/18/20 0118  AST 21  ALT 13  ALKPHOS 55  BILITOT 0.5  PROT 7.3  ALBUMIN 4.4   No results for input(s): LIPASE, AMYLASE in the last 168 hours. No results for input(s):  AMMONIA in the last 168 hours. Cardiac Enzymes: No results for input(s): CKTOTAL, CKMB, CKMBINDEX, TROPONINI in the last 168 hours. BNP (last 3 results) No results for input(s): BNP in the last 8760 hours. CBG: No results for input(s): GLUCAP in the last 168 hours.  Time spent: 35 minutes  Signed:  Berle Mull  [facility] 05/19/2020  6:34 PM

## 2020-05-22 LAB — SURGICAL PATHOLOGY

## 2020-05-29 ENCOUNTER — Other Ambulatory Visit: Payer: Self-pay | Admitting: Gastroenterology

## 2020-05-29 ENCOUNTER — Other Ambulatory Visit (HOSPITAL_COMMUNITY): Payer: Self-pay | Admitting: Gastroenterology

## 2020-05-30 ENCOUNTER — Telehealth: Payer: Self-pay | Admitting: Gastroenterology

## 2020-05-30 ENCOUNTER — Other Ambulatory Visit: Payer: Self-pay | Admitting: Gastroenterology

## 2020-05-30 ENCOUNTER — Other Ambulatory Visit (HOSPITAL_COMMUNITY): Payer: Self-pay | Admitting: Gastroenterology

## 2020-05-30 DIAGNOSIS — D500 Iron deficiency anemia secondary to blood loss (chronic): Secondary | ICD-10-CM

## 2020-05-30 DIAGNOSIS — K625 Hemorrhage of anus and rectum: Secondary | ICD-10-CM

## 2020-05-30 NOTE — Telephone Encounter (Signed)
05/30/20~LVM. MF 

## 2020-09-13 ENCOUNTER — Encounter: Payer: Self-pay | Admitting: Physician Assistant

## 2020-09-13 ENCOUNTER — Other Ambulatory Visit: Payer: Self-pay

## 2020-09-13 ENCOUNTER — Ambulatory Visit: Payer: Self-pay | Admitting: Physician Assistant

## 2020-09-13 DIAGNOSIS — Z113 Encounter for screening for infections with a predominantly sexual mode of transmission: Secondary | ICD-10-CM

## 2020-09-13 DIAGNOSIS — Z202 Contact with and (suspected) exposure to infections with a predominantly sexual mode of transmission: Secondary | ICD-10-CM

## 2020-09-13 MED ORDER — METRONIDAZOLE 500 MG PO TABS: 2000.0000 mg | ORAL_TABLET | Freq: Once | ORAL | 0 refills | Status: AC

## 2020-09-13 NOTE — Progress Notes (Signed)
   [facility] Department STI clinic/screening visit  Subjective:  Douglas Patton is a 34 y.o. male being seen today for an STI screening visit. The patient reports they do not have symptoms.    Patient has the following medical conditions:   Patient Active Problem List   Diagnosis Date Noted  . GI bleeding 05/18/2020  . GI bleed 03/02/2018  . Symptomatic anemia 03/02/2018  . TOBACCO DEPENDENCE 02/24/2007  . RHINITIS, ALLERGIC 02/24/2007  . ACNE 02/24/2007     Chief Complaint  Patient presents with  . SEXUALLY TRANSMITTED DISEASE    screening    HPI  Patient reports that he is not having any symptoms but is a contact to Trichomoniasis.  States that he has iron deficiency anemia and has had a transfusion earlier this year.  Reports that he has also had a HIV test earlier this year.   See flowsheet for further details and programmatic requirements.    The following portions of the patient's history were reviewed and updated as appropriate: allergies, current medications, past medical history, past social history, past surgical history and problem list.  Objective:  There were no vitals filed for this visit.  Physical Exam Constitutional:      General: He is not in acute distress.    Appearance: Normal appearance.  HENT:     Head: Normocephalic and atraumatic.  Eyes:     Conjunctiva/sclera: Conjunctivae normal.  Pulmonary:     Effort: Pulmonary effort is normal.  Skin:    General: Skin is warm and dry.  Neurological:     Mental Status: He is alert and oriented to person, place, and time.  Psychiatric:        Mood and Affect: Mood normal.        Behavior: Behavior normal.        Thought Content: Thought content normal.        Judgment: Judgment normal.       Assessment and Plan:  Douglas Patton is a 34 y.o. male presenting to the [facility] Department for STI screening  1. Venereal disease contact Will treat as a contact to  Trich with Metronidazole 2 g po with food, no EtOH for 24 hr before and until 72 hr after completing medicine.  No sex for 7 days and until after partner completes treatment. RTC for re-treatment if vomits < 2 hr after taking medicine. - metroNIDAZOLE (FLAGYL) 500 MG tablet; Take 4 tablets (2,000 mg total) by mouth once for 1 dose.  Dispense: 4 tablet; Refill: 0  2. Screening for STD (sexually transmitted disease) Patient into clinic without symptoms. Patient declines screening exam and blood work today.  Requests treatment as a contact only.  Rec condoms with all sex.      No follow-ups on file.  No future appointments.  Jerene Dilling, PA

## 2020-09-13 NOTE — Progress Notes (Signed)
Pt here today and reports he is a contact to [place] and is needing treatment.

## 2021-02-03 ENCOUNTER — Emergency Department
Admission: EM | Admit: 2021-02-03 | Discharge: 2021-02-03 | Disposition: A | Discharge status: Home / Self Care | Payer: Self-pay | Attending: Emergency Medicine | Admitting: Emergency Medicine

## 2021-02-03 ENCOUNTER — Emergency Department: Payer: Self-pay

## 2021-02-03 ENCOUNTER — Other Ambulatory Visit: Payer: Self-pay

## 2021-02-03 DIAGNOSIS — M5441 Lumbago with sciatica, right side: Secondary | ICD-10-CM | POA: Insufficient documentation

## 2021-02-03 DIAGNOSIS — M5442 Lumbago with sciatica, left side: Secondary | ICD-10-CM | POA: Insufficient documentation

## 2021-02-03 DIAGNOSIS — F17200 Nicotine dependence, unspecified, uncomplicated: Secondary | ICD-10-CM | POA: Insufficient documentation

## 2021-02-03 LAB — BASIC METABOLIC PANEL
Anion gap: 9 (ref 5–15)
BUN: 11 mg/dL (ref 6–20)
CO2: 25 mmol/L (ref 22–32)
Calcium: 9.4 mg/dL (ref 8.9–10.3)
Chloride: 106 mmol/L (ref 98–111)
Creatinine, Ser: 1.1 mg/dL (ref 0.61–1.24)
GFR, Estimated: >60 mL/min (ref >60)
Glucose, Bld: 88 mg/dL (ref 70–99)
Potassium: 3.7 mmol/L (ref 3.5–5.1)
Sodium: 140 mmol/L (ref 135–145)

## 2021-02-03 LAB — CBC WITH DIFFERENTIAL/PLATELET
Abs Immature Granulocytes: 0.01 10*3/uL (ref 0.00–0.07)
Basophils Absolute: 0.1 10*3/uL (ref 0.0–0.1)
Basophils Relative: 1 %
Eosinophils Absolute: 0.2 10*3/uL (ref 0.0–0.5)
Eosinophils Relative: 4 %
HCT: 34.8 % — ABNORMAL LOW (ref 39.0–52.0)
Hemoglobin: 9.9 g/dL — ABNORMAL LOW (ref 13.0–17.0)
Immature Granulocytes: 0 %
Lymphocytes Relative: 49 %
Lymphs Abs: 2.4 10*3/uL (ref 0.7–4.0)
MCH: 18.7 pg — ABNORMAL LOW (ref 26.0–34.0)
MCHC: 28.4 g/dL — ABNORMAL LOW (ref 30.0–36.0)
MCV: 65.8 fL — ABNORMAL LOW (ref 80.0–100.0)
Monocytes Absolute: 0.3 10*3/uL (ref 0.1–1.0)
Monocytes Relative: 6 %
Neutro Abs: 2 10*3/uL (ref 1.7–7.7)
Neutrophils Relative %: 40 %
Platelets: 258 10*3/uL (ref 150–400)
RBC: 5.29 MIL/uL (ref 4.22–5.81)
RDW: 20.3 % — ABNORMAL HIGH (ref 11.5–15.5)
WBC: 4.9 10*3/uL (ref 4.0–10.5)
nRBC: 0 % (ref 0.0–0.2)

## 2021-02-03 MED ORDER — MORPHINE SULFATE (PF) 4 MG/ML IV SOLN
4.0000 mg | Freq: Once | INTRAVENOUS | Status: AC
  Administered 2021-02-03: 4 mg via INTRAVENOUS
  Filled 2021-02-03: qty 1

## 2021-02-03 MED ORDER — CYCLOBENZAPRINE HCL 10 MG PO TABS
10.0000 mg | ORAL_TABLET | Freq: Three times a day (TID) | ORAL | 0 refills | Status: DC | PRN
Start: 2021-02-03 — End: 2022-09-03

## 2021-02-03 MED ORDER — OXYCODONE-ACETAMINOPHEN 5-325 MG PO TABS
1.0000 | ORAL_TABLET | ORAL | 0 refills | Status: AC | PRN
Start: 2021-02-03 — End: 2022-02-03

## 2021-02-03 MED ORDER — METHYLPREDNISOLONE SODIUM SUCC 125 MG IJ SOLR
125.0000 mg | Freq: Once | INTRAMUSCULAR | Status: AC
  Administered 2021-02-03: 125 mg via INTRAVENOUS
  Filled 2021-02-03: qty 2

## 2021-02-03 MED ORDER — LIDOCAINE 5 % EX PTCH
1.0000 | MEDICATED_PATCH | CUTANEOUS | Status: DC
  Administered 2021-02-03: 1 via TRANSDERMAL
  Filled 2021-02-03: qty 1

## 2021-02-03 MED ORDER — CYCLOBENZAPRINE HCL 10 MG PO TABS
10.0000 mg | ORAL_TABLET | Freq: Once | ORAL | Status: AC
  Administered 2021-02-03: 10 mg via ORAL
  Filled 2021-02-03: qty 1

## 2021-02-03 MED ORDER — ONDANSETRON HCL 4 MG/2ML IJ SOLN
4.0000 mg | Freq: Once | INTRAMUSCULAR | Status: AC
  Administered 2021-02-03: 4 mg via INTRAVENOUS
  Filled 2021-02-03: qty 2

## 2021-02-03 MED ORDER — METHYLPREDNISOLONE 4 MG PO TBPK
ORAL_TABLET | ORAL | 0 refills | Status: DC
Start: 2021-02-03 — End: 2022-09-03

## 2021-02-03 NOTE — Discharge Instructions (Addendum)
Follow-up with orthopedics if not improving in 5 to 7 days.  Return emergency department worsening.  Take medication as prescribed.  Apply wet heat followed by ice to the lower back.  Trying to do small stretches by pulling your knees towards your chest as the medication start to help.

## 2021-02-03 NOTE — ED Triage Notes (Signed)
Pt comes with c/o lower back pain. Pt states pain is shooting down and down his legs. Pt states 10/10 pain.

## 2021-02-03 NOTE — ED Notes (Signed)
See triage note   Presents with lower back pain  States pain is moving into legs  Describes pain as severe  No known injury

## 2021-02-03 NOTE — ED Provider Notes (Signed)
[facility] Emergency Department Provider Note  ____________________________________________   Event Date/Time   First MD Initiated Contact with Patient 02/03/21 1615     (approximate)  I have reviewed the triage vital signs and the nursing notes.   HISTORY  Chief Complaint Back Pain    HPI Douglas Patton is a 35 y.o. male  C/o low back pain for 2 day, no known injury, pain is worse with movement, increased with bending over, denies numbness, tingling, or changes in bowel/urinary habits,  Using otc meds without relief. Patient states that see sharp pain in the lower back. Patient states it radiates to the top of both thighs. States it takes about 30 minutes to start being able to take a normal step. Is not dragging his leg. States it's been very uncomfortable with any movement. Patient has anemia and gets iron infusions. Last infusion was 6 to 8 months ago. Patient denies any IV drug use. He denies fever or chills. Remainder ros neg   Past Medical History:  Diagnosis Date  . Anemia   . Bronchitis   . Tobacco abuse     Patient Active Problem List   Diagnosis Date Noted  . GI bleeding 05/18/2020  . GI bleed 03/02/2018  . Symptomatic anemia 03/02/2018  . TOBACCO DEPENDENCE 02/24/2007  . RHINITIS, ALLERGIC 02/24/2007  . ACNE 02/24/2007    Past Surgical History:  Procedure Laterality Date  . COLONOSCOPY N/A 03/04/2018   Procedure: COLONOSCOPY;  Surgeon: Lin Landsman, MD;  Location: [facility] ENDOSCOPY;  Service: Gastroenterology;  Laterality: N/A;  . COLONOSCOPY N/A 05/19/2020   Procedure: COLONOSCOPY;  Surgeon: Toledo, Benay Pike, MD;  Location: [facility] ENDOSCOPY;  Service: Gastroenterology;  Laterality: N/A;  . ESOPHAGOGASTRODUODENOSCOPY N/A 03/04/2018   Procedure: ESOPHAGOGASTRODUODENOSCOPY (EGD);  Surgeon: Lin Landsman, MD;  Location: [facility] ENDOSCOPY;  Service: Gastroenterology;  Laterality: N/A;  . ESOPHAGOGASTRODUODENOSCOPY N/A 05/19/2020    Procedure: ESOPHAGOGASTRODUODENOSCOPY (EGD);  Surgeon: Toledo, Benay Pike, MD;  Location: [facility] ENDOSCOPY;  Service: Gastroenterology;  Laterality: N/A;  . NO PAST SURGERIES      Prior to Admission medications   Medication Sig Start Date End Date Taking? Authorizing Provider  cyclobenzaprine (FLEXERIL) 10 MG tablet Take 1 tablet (10 mg total) by mouth 3 (three) times daily as needed. 02/03/21  Yes Randall Colden, Linden Dolin, PA-C  methylPREDNISolone (MEDROL DOSEPAK) 4 MG TBPK tablet Take 6 pills on day one then decrease by 1 pill each day 02/03/21  Yes Lyrical Sowle, Linden Dolin, PA-C  oxyCODONE-acetaminophen (PERCOCET) 5-325 MG tablet Take 1 tablet by mouth every 4 (four) hours as needed for severe pain. 02/03/21 02/03/22 Yes Delailah Spieth, Linden Dolin, PA-C  folic acid (FOLVITE) 1 MG tablet Take 1 tablet (1 mg total) by mouth daily. 05/19/20 05/19/21  Lavina Hamman, MD  iron polysaccharides (NIFEREX) 150 MG capsule Take 1 capsule (150 mg total) by mouth 2 (two) times daily. 05/19/20   Lavina Hamman, MD  pantoprazole (PROTONIX) 40 MG tablet Take 1 tablet (40 mg total) by mouth 2 (two) times daily before a meal for 14 days. 05/19/20 06/02/20  Lavina Hamman, MD  vitamin B-12 (CYANOCOBALAMIN) 1000 MCG tablet Take 1 tablet (1,000 mcg total) by mouth daily. 05/19/20   Lavina Hamman, MD    Allergies Patient has no known allergies.  Family History  Problem Relation Age of Onset  . Diabetes Mellitus II Mother   . CVA Father     Social History Social History   Tobacco Use  .  Smoking status: Current Every Day Smoker  . Smokeless tobacco: Never Used  Substance Use Topics  . Alcohol use: Not Currently  . Drug use: Not Currently    Types: Marijuana    Review of Systems  Constitutional: No fever/chills Eyes: No visual changes. ENT: No sore throat. Respiratory: Denies cough Genitourinary: Negative for dysuria. Musculoskeletal: Positive for back pain. Skin: Negative for  rash.    ____________________________________________   PHYSICAL EXAM:  VITAL SIGNS: ED Triage Vitals  Enc Vitals Group     BP 02/03/21 1312 130/75     Pulse Rate 02/03/21 1312 79     Resp 02/03/21 1312 18     Temp 02/03/21 1312 98.7 F (37.1 C)     Temp Source 02/03/21 1312 Oral     SpO2 02/03/21 1312 100 %     Weight 02/03/21 1515 185 lb 3 oz (84 kg)     Height 02/03/21 1515 5\' 11"  (1.803 m)     Head Circumference --      Peak Flow --      Pain Score 02/03/21 1319 10     Pain Loc --      Pain Edu? --      Excl. in [place]? --     Constitutional: Alert and oriented. Well appearing and in no acute distress. Eyes: Conjunctivae are normal.  Head: Atraumatic. Nose: No congestion/rhinnorhea. Mouth/Throat: Mucous membranes are moist.   Neck:  supple no lymphadenopathy noted Cardiovascular: Normal rate, regular rhythm.  Respiratory: Normal respiratory effort.  No retractions,  Abd: soft nontender bs normal all 4 quad GU: deferred Musculoskeletal:  Decreased rom of back due to discomfort, lumbar spine tender, unable to assess slr secondary discomfort, decreased strength in great toes b/l, decreased strength in lower legs, n/v intact Neurologic:  Normal speech and language.  Skin:  Skin is warm, dry and intact. No rash noted. Psychiatric: Mood and affect are normal. Speech and behavior are normal.  ____________________________________________   LABS (all labs ordered are listed, but only abnormal results are displayed)  Labs Reviewed  CBC WITH DIFFERENTIAL/PLATELET - Abnormal; Notable for the following components:      Result Value   Hemoglobin 9.9 (*)    HCT 34.8 (*)    MCV 65.8 (*)    MCH 18.7 (*)    MCHC 28.4 (*)    RDW 20.3 (*)    All other components within normal limits  BASIC METABOLIC PANEL   ____________________________________________   ____________________________________________  RADIOLOGY  X-ray lumbar  spine  ____________________________________________   PROCEDURES  Procedure(s) performed: No  Procedures    ____________________________________________   INITIAL IMPRESSION / ASSESSMENT AND PLAN / ED COURSE  Pertinent labs & imaging results that were available during my care of the patient were reviewed by me and considered in my medical decision making (see chart for details).   The patient is a 35 year old male presents with low back pain. See HPI. Physical exam shows patient began a lot of discomfort. Patient has tears in his eyes upon movement and palpation of his spine. Patient does seem to have decreased strength in the lower extremities, however I'm thinking this is mainly due to the amount of pain he is in. We'll reassess after the patient's had pain medication.  Patient was given morphine 4 mg IV, Zofran 4 mg IV CBC and metabolic panel to assess for infection  X-ray of the lumbar spine following pain medication  Clinical Course as of 02/03/21 2118  [facility] Feb 03, 2021  2116 DG Lumbar Spine 2-3 Views [SF]    Clinical Course User Index [SF] Versie Starks, PA-C  X-ray lumbar spine reviewed by me and read confirmed by radiology is negative for any acute abnormality.  CBC shows anemia but is improved from his last labs and his past medical history, metabolic panel is normal  Patient has relief with pain medication when lying still.  Movement still reproduces pain.  I did offer to do an MRI tonight.  Patient states he would like to try medications first due to finances.  He was given a prescription for Flexeril, Medrol Dosepak, and oxycodone.  He is to follow-up with orthopedics if not improving in 5 to 7 days.  Return the emergency department worsening.  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  Informed him we cannot fill out FMLA or disability forms.  He was discharged in stable condition in the care of his wife  As part of my medical decision  making, I reviewed the following data within the [place] History obtained from family, Nursing notes reviewed and incorporated, Labs reviewed , Old chart reviewed, Radiograph reviewed , Notes from prior ED visits and [place] Controlled Substance Database  ____________________________________________   FINAL CLINICAL IMPRESSION(S) / ED DIAGNOSES  Final diagnoses:  Acute midline low back pain with bilateral sciatica      NEW MEDICATIONS STARTED DURING THIS VISIT:  Discharge Medication List as of 02/03/2021  6:07 PM    START taking these medications   Details  cyclobenzaprine (FLEXERIL) 10 MG tablet Take 1 tablet (10 mg total) by mouth 3 (three) times daily as needed., Starting Mon 02/03/2021, Normal    methylPREDNISolone (MEDROL DOSEPAK) 4 MG TBPK tablet Take 6 pills on day one then decrease by 1 pill each day, Normal    oxyCODONE-acetaminophen (PERCOCET) 5-325 MG tablet Take 1 tablet by mouth every 4 (four) hours as needed for severe pain., Starting Mon 02/03/2021, Until Tue 02/03/2022 at 2359, Normal         Note:  This document was prepared using Dragon voice recognition software and may include unintentional dictation errors.     Versie Starks, PA-C 02/03/21 2118    Naaman Plummer, MD 02/03/21 2239

## 2021-05-09 IMAGING — CR DG LUMBAR SPINE 2-3V
1 series · 4 of 4 positions shown · non-contrast
Comparison: CT scan 10/15/2019

CLINICAL DATA: Low back and bilateral leg pain.

EXAM:
LUMBAR SPINE - 2-3 VIEW

[Series 1: dg lumbar spine 2-3 views · 0.14mm/px · 4 of 4 slices shown]
[im 1/4]
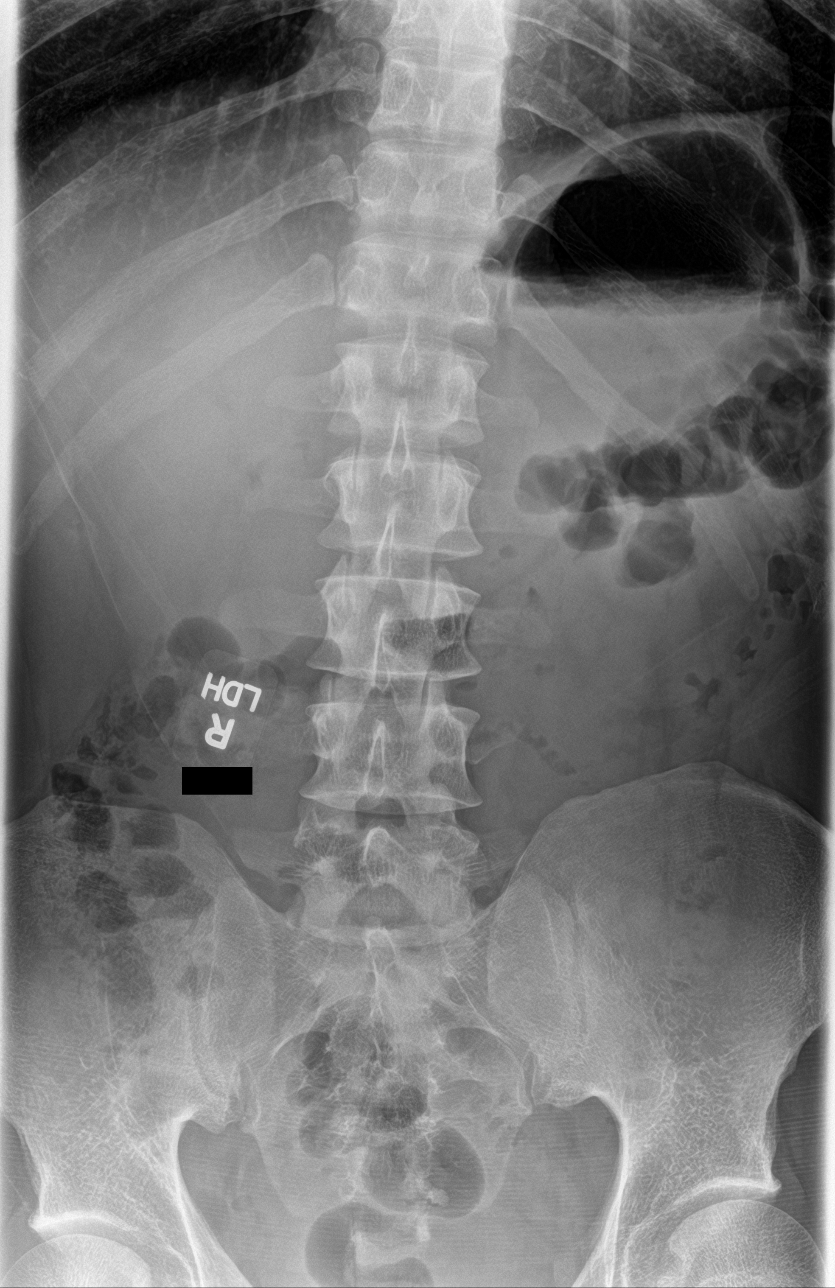
[im 2/4]
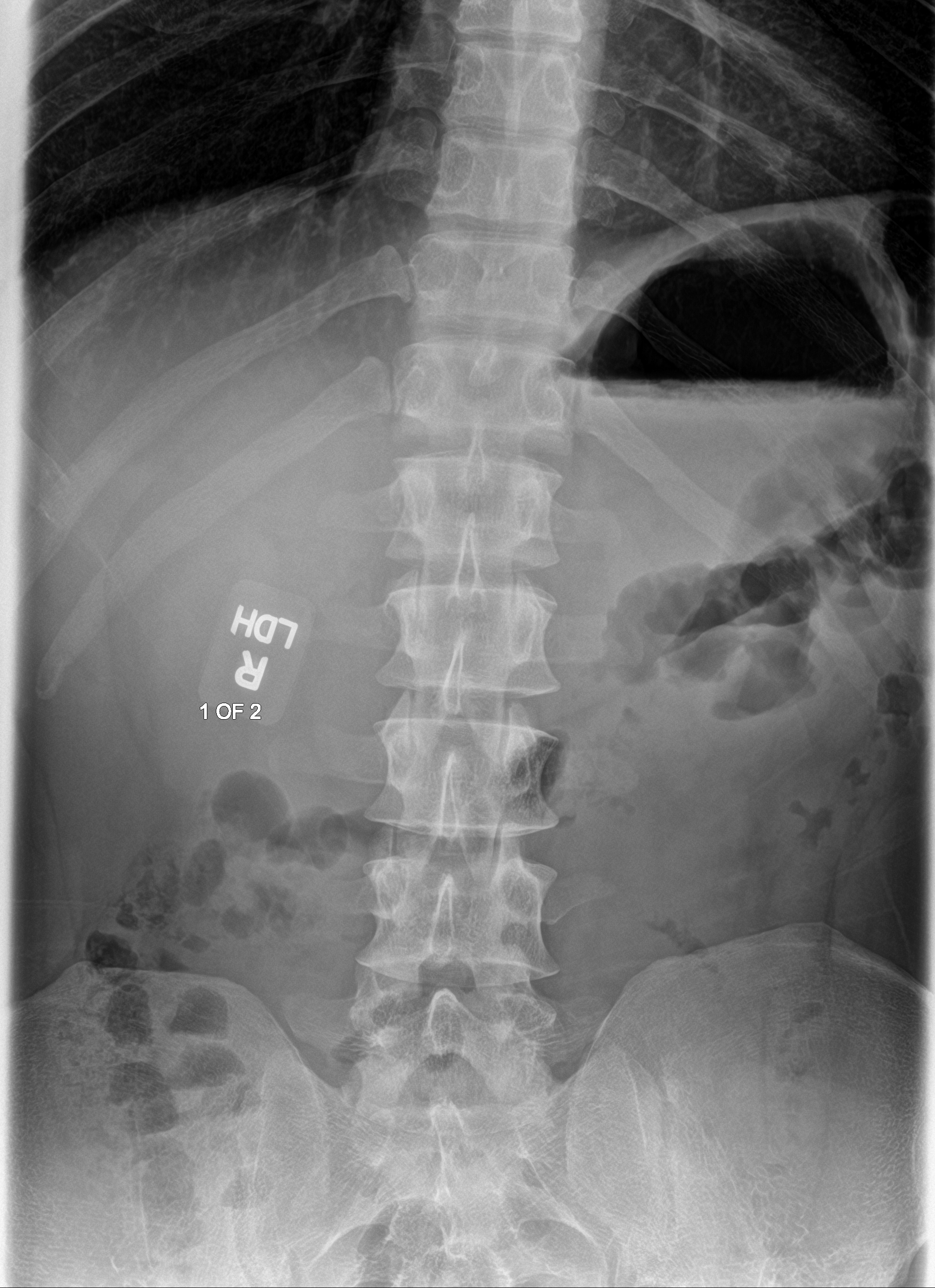
[im 3/4]
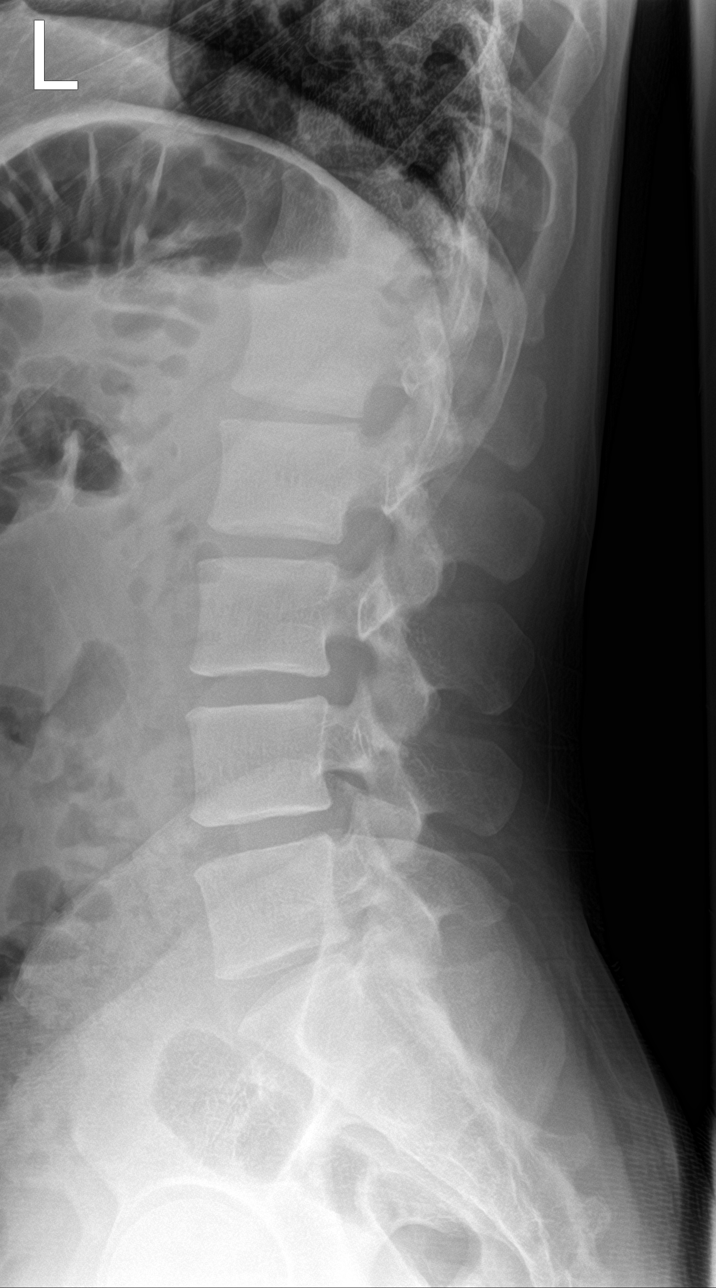
[im 4/4]
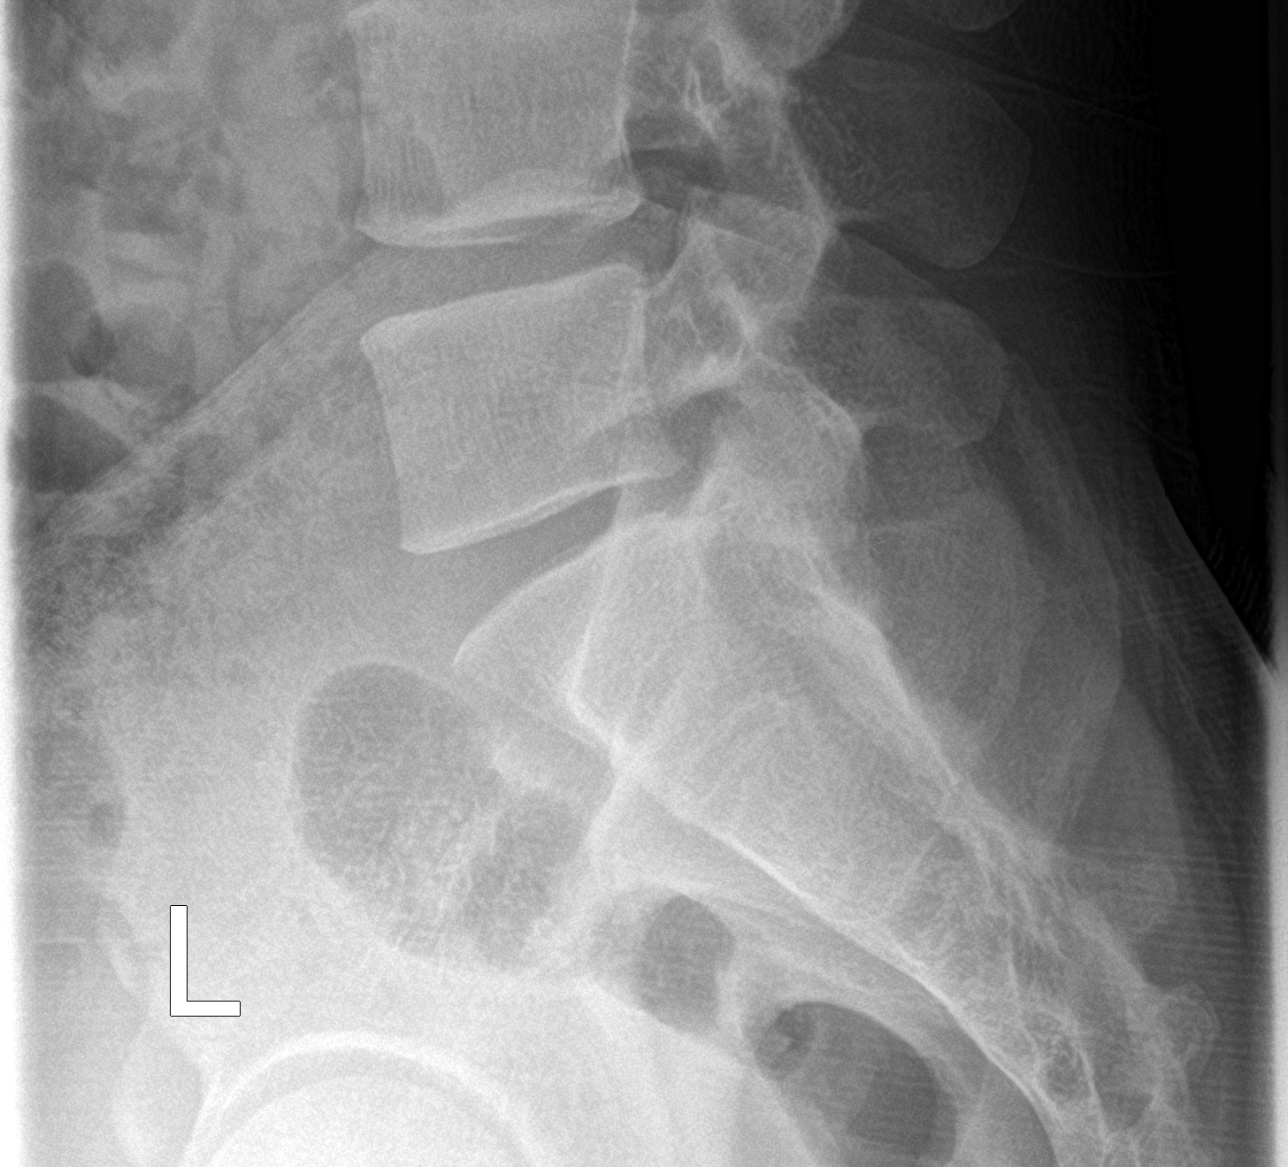

[4 of 4 positions shown; findings below may reference images not displayed]

FINDINGS: Normal alignment of the lumbar vertebral bodies. Disc spaces and
vertebral bodies are maintained. The facets are normally aligned. No
pars defects. The visualized bony pelvis is intact.
IMPRESSION: Normal lumbar spine radiographs.

## 2022-08-31 ENCOUNTER — Other Ambulatory Visit: Payer: Self-pay

## 2022-08-31 ENCOUNTER — Emergency Department: Payer: Managed Care, Other (non HMO)

## 2022-08-31 ENCOUNTER — Encounter: Payer: Self-pay | Admitting: Intensive Care

## 2022-08-31 ENCOUNTER — Observation Stay
Admission: EM | Admit: 2022-08-31 | Discharge: 2022-09-03 | Disposition: A | Discharge status: Home / Self Care | Payer: Managed Care, Other (non HMO) | Attending: Emergency Medicine | Admitting: Emergency Medicine

## 2022-08-31 DIAGNOSIS — Z20822 Contact with and (suspected) exposure to covid-19: Secondary | ICD-10-CM | POA: Insufficient documentation

## 2022-08-31 DIAGNOSIS — K642 Third degree hemorrhoids: Secondary | ICD-10-CM | POA: Diagnosis not present

## 2022-08-31 DIAGNOSIS — K2981 Duodenitis with bleeding: Secondary | ICD-10-CM | POA: Diagnosis not present

## 2022-08-31 DIAGNOSIS — K922 Gastrointestinal hemorrhage, unspecified: Secondary | ICD-10-CM | POA: Diagnosis present

## 2022-08-31 DIAGNOSIS — K649 Unspecified hemorrhoids: Secondary | ICD-10-CM

## 2022-08-31 DIAGNOSIS — K2971 Gastritis, unspecified, with bleeding: Secondary | ICD-10-CM | POA: Diagnosis not present

## 2022-08-31 DIAGNOSIS — D500 Iron deficiency anemia secondary to blood loss (chronic): Secondary | ICD-10-CM

## 2022-08-31 DIAGNOSIS — F17210 Nicotine dependence, cigarettes, uncomplicated: Secondary | ICD-10-CM | POA: Insufficient documentation

## 2022-08-31 DIAGNOSIS — D649 Anemia, unspecified: Secondary | ICD-10-CM | POA: Diagnosis present

## 2022-08-31 DIAGNOSIS — F17200 Nicotine dependence, unspecified, uncomplicated: Secondary | ICD-10-CM | POA: Diagnosis present

## 2022-08-31 DIAGNOSIS — D62 Acute posthemorrhagic anemia: Secondary | ICD-10-CM | POA: Diagnosis present

## 2022-08-31 DIAGNOSIS — K921 Melena: Principal | ICD-10-CM | POA: Insufficient documentation

## 2022-08-31 DIAGNOSIS — Z79899 Other long term (current) drug therapy: Secondary | ICD-10-CM | POA: Diagnosis not present

## 2022-08-31 DIAGNOSIS — K625 Hemorrhage of anus and rectum: Secondary | ICD-10-CM | POA: Diagnosis not present

## 2022-08-31 DIAGNOSIS — D509 Iron deficiency anemia, unspecified: Secondary | ICD-10-CM | POA: Insufficient documentation

## 2022-08-31 DIAGNOSIS — R531 Weakness: Secondary | ICD-10-CM | POA: Diagnosis present

## 2022-08-31 LAB — COMPREHENSIVE METABOLIC PANEL
ALT: 12 U/L (ref 0–44)
AST: 19 U/L (ref 15–41)
Albumin: 3.6 g/dL (ref 3.5–5.0)
Alkaline Phosphatase: 43 U/L (ref 38–126)
Anion gap: 3 — ABNORMAL LOW (ref 5–15)
BUN: 9 mg/dL (ref 6–20)
CO2: 26 mmol/L (ref 22–32)
Calcium: 8.2 mg/dL — ABNORMAL LOW (ref 8.9–10.3)
Chloride: 109 mmol/L (ref 98–111)
Creatinine, Ser: 1.05 mg/dL (ref 0.61–1.24)
GFR, Estimated: >60 mL/min (ref >60)
Glucose, Bld: 91 mg/dL (ref 70–99)
Potassium: 3.7 mmol/L (ref 3.5–5.1)
Sodium: 138 mmol/L (ref 135–145)
Total Bilirubin: 0.7 mg/dL (ref 0.3–1.2)
Total Protein: 6.1 g/dL — ABNORMAL LOW (ref 6.5–8.1)

## 2022-08-31 LAB — CBC WITH DIFFERENTIAL/PLATELET
Abs Immature Granulocytes: 0.01 10*3/uL (ref 0.00–0.07)
Basophils Absolute: 0 10*3/uL (ref 0.0–0.1)
Basophils Relative: 1 %
Eosinophils Absolute: 0.2 10*3/uL (ref 0.0–0.5)
Eosinophils Relative: 4 %
HCT: 16.9 % — ABNORMAL LOW (ref 39.0–52.0)
Hemoglobin: 4.8 g/dL — CL (ref 13.0–17.0)
Immature Granulocytes: 0 %
Lymphocytes Relative: 49 %
Lymphs Abs: 2 10*3/uL (ref 0.7–4.0)
MCH: 20 pg — ABNORMAL LOW (ref 26.0–34.0)
MCHC: 28.4 g/dL — ABNORMAL LOW (ref 30.0–36.0)
MCV: 70.4 fL — ABNORMAL LOW (ref 80.0–100.0)
Monocytes Absolute: 0.3 10*3/uL (ref 0.1–1.0)
Monocytes Relative: 7 %
Neutro Abs: 1.6 10*3/uL — ABNORMAL LOW (ref 1.7–7.7)
Neutrophils Relative %: 39 %
Platelets: 240 10*3/uL (ref 150–400)
RBC: 2.4 MIL/uL — ABNORMAL LOW (ref 4.22–5.81)
RDW: 17.2 % — ABNORMAL HIGH (ref 11.5–15.5)
WBC: 4.1 10*3/uL (ref 4.0–10.5)
nRBC: 0 % (ref 0.0–0.2)

## 2022-08-31 LAB — PREPARE RBC (CROSSMATCH)

## 2022-08-31 LAB — RESP PANEL BY RT-PCR (FLU A&B, COVID) ARPGX2
Influenza A by PCR: NEGATIVE
Influenza B by PCR: NEGATIVE
SARS Coronavirus 2 by RT PCR: NEGATIVE

## 2022-08-31 MED ORDER — IOHEXOL 350 MG/ML SOLN
100.0000 mL | Freq: Once | INTRAVENOUS | Status: AC | PRN
  Administered 2022-08-31: 100 mL via INTRAVENOUS

## 2022-08-31 MED ORDER — PANTOPRAZOLE 80MG IVPB - SIMPLE MED
80.0000 mg | Freq: Once | INTRAVENOUS | Status: AC
  Administered 2022-08-31: 80 mg via INTRAVENOUS
  Filled 2022-08-31: qty 100

## 2022-08-31 MED ORDER — ONDANSETRON HCL 4 MG/2ML IJ SOLN
4.0000 mg | Freq: Four times a day (QID) | INTRAMUSCULAR | Status: DC | PRN
  Filled 2022-08-31: qty 2

## 2022-08-31 MED ORDER — NICOTINE 21 MG/24HR TD PT24
21.0000 mg | MEDICATED_PATCH | Freq: Every day | TRANSDERMAL | Status: DC
  Administered 2022-08-31 – 2022-09-03 (×4): 21 mg via TRANSDERMAL
  Filled 2022-08-31 (×4): qty 1

## 2022-08-31 MED ORDER — PANTOPRAZOLE SODIUM 40 MG IV SOLR: 40.0000 mg | Freq: Two times a day (BID) | INTRAVENOUS | Status: DC

## 2022-08-31 MED ORDER — SODIUM CHLORIDE 0.9% IV SOLUTION
Freq: Once | INTRAVENOUS | Status: AC
  Filled 2022-08-31: qty 250

## 2022-08-31 MED ORDER — PANTOPRAZOLE INFUSION (NEW) - SIMPLE MED
8.0000 mg/h | INTRAVENOUS | Status: DC
  Administered 2022-08-31 – 2022-09-02 (×4): 8 mg/h via INTRAVENOUS
  Filled 2022-08-31 (×6): qty 100

## 2022-08-31 MED ORDER — ONDANSETRON HCL 4 MG PO TABS: 4.0000 mg | ORAL_TABLET | Freq: Four times a day (QID) | ORAL | Status: DC | PRN

## 2022-08-31 MED ORDER — LACTATED RINGERS IV SOLN: INTRAVENOUS | Status: DC

## 2022-08-31 NOTE — ED Provider Notes (Signed)
[facility] Provider Note    Event Date/Time   First MD Initiated Contact with Patient 08/31/22 1714     (approximate)   History   Weakness   HPI  Douglas Patton is a 36 y.o. male   Past medical history of anemia previously evaluated in years past thought to be related to external hemorrhoids, potentially iron deficiency anemia, presents with generalized weakness developing over the past month progressively with some lower GI bleeding intermittent bright red blood per rectum along with brown formed stools and blood on the toilet paper when he wipes.  He has known hemorrhoids.  Patient denies history of blood disorders including sickle cell disease.  He has had upper and lower scopes in 2020 which revealed small polyps, external hemorrhoids, and negative upper endoscopy.  He had been on iron for iron deficiency anemia per one of his doctors but he stopped taking early December.  He is not on any special diets and eats a variety of foods.  History was obtained via the patient and a review of external medical notes.      Physical Exam   Triage Vital Signs: ED Triage Vitals  Enc Vitals Group     BP 08/31/22 1705 (!) 145/73     Pulse Rate 08/31/22 1705 88     Resp 08/31/22 1705 18     Temp 08/31/22 1709 97.9 F (36.6 C)     Temp Source 08/31/22 1709 Oral     SpO2 08/31/22 1705 94 %     Weight 08/31/22 1706 200 lb (90.7 kg)     Height 08/31/22 1706 6' (1.829 m)     Head Circumference --      Peak Flow --      Pain Score 08/31/22 1706 0     Pain Loc --      Pain Edu? --      Excl. in [place]? --     Most recent vital signs: Vitals:   08/31/22 2340 08/31/22 2357  BP: 126/62 119/64  Pulse: 78 67  Resp: 18 18  Temp: 97.9 F (36.6 C) 98.1 F (36.7 C)  SpO2:  100%    General: Awake, no distress.  Pale conjunctiva CV:  Heart rate 67 normotensive Resp:  Normal effort.  Abd:  No distention.  Nontender to palpation Other:  He has  hemorrhoids that have scant blood, no significant active bleeding, brown stool on rectal exam.   ED Results / Procedures / Treatments   Labs (all labs ordered are listed, but only abnormal results are displayed) Labs Reviewed  COMPREHENSIVE METABOLIC PANEL - Abnormal; Notable for the following components:      Result Value   Calcium 8.2 (*)    Total Protein 6.1 (*)    Anion gap 3 (*)    All other components within normal limits  CBC WITH DIFFERENTIAL/PLATELET - Abnormal; Notable for the following components:   RBC 2.40 (*)    Hemoglobin 4.8 (*)    HCT 16.9 (*)    MCV 70.4 (*)    MCH 20.0 (*)    MCHC 28.4 (*)    RDW 17.2 (*)    Neutro Abs 1.6 (*)    All other components within normal limits  RESP PANEL BY RT-PCR (FLU A&B, COVID) ARPGX2  URINALYSIS, ROUTINE W REFLEX MICROSCOPIC  COMPREHENSIVE METABOLIC PANEL  HIV ANTIBODY (ROUTINE TESTING W REFLEX)  TYPE AND SCREEN  PREPARE RBC (CROSSMATCH)     I reviewed labs  and they are notable for a hemoglobin of 4.8, markedly decreased from prior later this year.  RADIOLOGY I independently reviewed and interpreted ET angiogram of the abdomen and pelvis with no significant active bleeding.   PROCEDURES:  Critical Care performed: No  Procedures   MEDICATIONS ORDERED IN ED: Medications  0.9 %  sodium chloride infusion (Manually program via Guardrails IV Fluids) (has no administration in time range)  lactated ringers infusion (has no administration in time range)  ondansetron (ZOFRAN) tablet 4 mg (has no administration in time range)    Or  ondansetron (ZOFRAN) injection 4 mg (has no administration in time range)  nicotine (NICODERM CQ - dosed in mg/24 hours) patch 21 mg (21 mg Transdermal Patch Applied 08/31/22 2214)  pantoprozole (PROTONIX) 80 mg /NS 100 mL infusion (8 mg/hr Intravenous New Bag/Given 08/31/22 2234)  pantoprazole (PROTONIX) injection 40 mg (has no administration in time range)  iohexol (OMNIPAQUE) 350 MG/ML  injection 100 mL (100 mLs Intravenous Contrast Given 08/31/22 1940)  pantoprazole (PROTONIX) 80 mg /NS 100 mL IVPB (0 mg Intravenous Stopped 08/31/22 2234)    Consultants:  I spoke with hospitalist regarding admission, gastroenterologist Dr. Alice Reichert, surgeon Dr. Dahlia Byes Regarding care plan for this patient.   IMPRESSION / MDM / ASSESSMENT AND PLAN / ED COURSE  I reviewed the triage vital signs and the nursing notes.                              Differential diagnosis includes, but is not limited to, lower GI bleed, anemia, iron deficient anemia   MDM: Lower GI bleeding with known hemorrhoids likely chronic and progressive given normal hemodynamics and progressive nature of symptomatic anemia with hemoglobin in the fours requiring blood transfusion.  Patient was consented for blood transfusion and consulted GI who reviewed his history, surgery who requested a CT angiogram of the abdomen and pelvis.   Patient was admitted to the hospitalist service for further work-up of anemia.    Patient's presentation is most consistent with acute presentation with potential threat to life or bodily function.       FINAL CLINICAL IMPRESSION(S) / ED DIAGNOSES   Final diagnoses:  Symptomatic anemia  Lower GI bleed  Hemorrhoids, unspecified hemorrhoid type     Rx / DC Orders   ED Discharge Orders     None        Note:  This document was prepared using Dragon voice recognition software and may include unintentional dictation errors.    Lucillie Garfinkel, MD 09/01/22 [phone]

## 2022-08-31 NOTE — H&P (Signed)
History and Physical    Douglas Patton:[id number] DOB: 06/10/1986 DOA: 08/31/2022  PCP: Patient, No Pcp Per    Patient coming from:  Home    Chief Complaint:  Rectal bleeding.    HPI:  Douglas Patton is a 36 y.o. male seen in ed for rectal bleeding. Case D/W Dr.toledo and said that Nuclear RBC tagged scan is being replaced by CTA which is recommended by Gen surg.  Chart review shows patient was last discharged in 2000 presentation of acute blood loss anemia from GI bleeding suspected from possibly inflammatory bowel disease.  Patient underwent endoscopy and colonoscopy which was positive for areas of erythema and sessile polyp which were resected.  First rectal bleeding: Age of 15/15. Since then he has had essentially once every year. PT STATES HE TOOK B/C POWDERS BEFORE. FIRST TIME HE TOOK B/C POWDER AT AGE OF 17 FOR MUSCLE injury and stiffness. OVER NEXT FEW YEAR HE HAS TAKEN 5 B/C POWDERS  OVERALL.  Last time he took a b/c powder or similar 2014.  Pt has taken steroids twice for musculoskeletal issue he was a Psychologist, educational. Pt smokes cigarettes 1/3 PPD since age of 61.  Pt has never had any alcohol use. Currently he has NAUSEA. Pt states drained all week.  Heavy headaches / palpitations. Massive heartburn and takes pills.  Off of PPI meds for a while.  Pt smokes THC. No bowel movement changes.  Pt does reports intermittent abd pain and diarrhea with resolution, thinks it is a stomach bug.    Pt has past medical history: Past Medical History:  Diagnosis Date   Anemia    Bronchitis    Tobacco abuse     Past Surgical History:  Procedure Laterality Date   COLONOSCOPY N/A 03/04/2018   Procedure: COLONOSCOPY;  Surgeon: Lin Landsman, MD;  Location: [facility] ENDOSCOPY;  Service: Gastroenterology;  Laterality: N/A;   COLONOSCOPY N/A 05/19/2020   Procedure: COLONOSCOPY;  Surgeon: Toledo, Benay Pike, MD;  Location: [facility] ENDOSCOPY;  Service: Gastroenterology;   Laterality: N/A;   ESOPHAGOGASTRODUODENOSCOPY N/A 03/04/2018   Procedure: ESOPHAGOGASTRODUODENOSCOPY (EGD);  Surgeon: Lin Landsman, MD;  Location: [facility] ENDOSCOPY;  Service: Gastroenterology;  Laterality: N/A;   ESOPHAGOGASTRODUODENOSCOPY N/A 05/19/2020   Procedure: ESOPHAGOGASTRODUODENOSCOPY (EGD);  Surgeon: Toledo, Benay Pike, MD;  Location: [facility] ENDOSCOPY;  Service: Gastroenterology;  Laterality: N/A;   NO PAST SURGERIES       reports that he has been smoking cigarettes. He has never used smokeless tobacco. He reports current alcohol use. He reports that he does not currently use drugs after having used the following drugs: Marijuana.  No Known Allergies  Family History  Problem Relation Age of Onset   Diabetes Mellitus II Mother    CVA Father     Prior to Admission medications   Medication Sig Start Date End Date Taking? Authorizing Provider  cyclobenzaprine (FLEXERIL) 10 MG tablet Take 1 tablet (10 mg total) by mouth 3 (three) times daily as needed. 02/03/21   Caryn Section Linden Dolin, PA-C  iron polysaccharides (NIFEREX) 150 MG capsule Take 1 capsule (150 mg total) by mouth 2 (two) times daily. 05/19/20   Lavina Hamman, MD  methylPREDNISolone (MEDROL DOSEPAK) 4 MG TBPK tablet Take 6 pills on day one then decrease by 1 pill each day 02/03/21   Versie Starks, PA-C  pantoprazole (PROTONIX) 40 MG tablet Take 1 tablet (40 mg total) by mouth 2 (two) times daily before a meal for 14 days. 05/19/20 06/02/20  Lavina Hamman, MD  vitamin B-12 (CYANOCOBALAMIN) 1000 MCG tablet Take 1 tablet (1,000 mcg total) by mouth daily. 05/19/20   Lavina Hamman, MD    Review of Systems:  Review of Systems  Gastrointestinal:  Positive for blood in stool and nausea.  All other systems reviewed and are negative.    ED Course: > Vitals:   08/31/22 1706 08/31/22 1709 08/31/22 2043 08/31/22 2059  BP:   125/73 127/67  Pulse:   64 79  Resp:   18 16  Temp:  97.9 F (36.6 C) 98.2 F (36.8 C) 98 F (36.7 C)   TempSrc:  Oral Oral Oral  SpO2:   100% 100%  Weight: 90.7 kg     Height: 6' (1.829 m)      > Vitals:   08/31/22 1705 08/31/22 2043 08/31/22 2059  BP: (!) 145/73 125/73 127/67    >No intake/output data recorded. >No intake/output data recorded. >SpO2: 100 %    Results for orders placed or performed during the hospital encounter of 08/31/22 (from the past 24 hour(s))  Comprehensive metabolic panel     Status: Abnormal   Collection Time: 08/31/22  6:24 PM  Result Value Ref Range   Sodium 138 135 - 145 mmol/L   Potassium 3.7 3.5 - 5.1 mmol/L   Chloride 109 98 - 111 mmol/L   CO2 26 22 - 32 mmol/L   Glucose, Bld 91 70 - 99 mg/dL   BUN 9 6 - 20 mg/dL   Creatinine, Ser 1.05 0.61 - 1.24 mg/dL   Calcium 8.2 (L) 8.9 - 10.3 mg/dL   Total Protein 6.1 (L) 6.5 - 8.1 g/dL   Albumin 3.6 3.5 - 5.0 g/dL   AST 19 15 - 41 U/L   ALT 12 0 - 44 U/L   Alkaline Phosphatase 43 38 - 126 U/L   Total Bilirubin 0.7 0.3 - 1.2 mg/dL   GFR, Estimated >60 >60 mL/min   Anion gap 3 (L) 5 - 15  CBC with Differential     Status: Abnormal   Collection Time: 08/31/22  6:24 PM  Result Value Ref Range   WBC 4.1 4.0 - 10.5 K/uL   RBC 2.40 (L) 4.22 - 5.81 MIL/uL   Hemoglobin 4.8 (LL) 13.0 - 17.0 g/dL   HCT 16.9 (L) 39.0 - 52.0 %   MCV 70.4 (L) 80.0 - 100.0 fL   MCH 20.0 (L) 26.0 - 34.0 pg   MCHC 28.4 (L) 30.0 - 36.0 g/dL   RDW 17.2 (H) 11.5 - 15.5 %   Platelets 240 150 - 400 K/uL   nRBC 0.0 0.0 - 0.2 %   Neutrophils Relative % 39 %   Neutro Abs 1.6 (L) 1.7 - 7.7 K/uL   Lymphocytes Relative 49 %   Lymphs Abs 2.0 0.7 - 4.0 K/uL   Monocytes Relative 7 %   Monocytes Absolute 0.3 0.1 - 1.0 K/uL   Eosinophils Relative 4 %   Eosinophils Absolute 0.2 0.0 - 0.5 K/uL   Basophils Relative 1 %   Basophils Absolute 0.0 0.0 - 0.1 K/uL   Immature Granulocytes 0 %   Abs Immature Granulocytes 0.01 0.00 - 0.07 K/uL  Resp Panel by RT-PCR (Flu A&B, Covid) Anterior Nasal Swab     Status: None   Collection Time:  08/31/22  6:24 PM   Specimen: Anterior Nasal Swab  Result Value Ref Range   SARS Coronavirus 2 by RT PCR NEGATIVE NEGATIVE   Influenza A by PCR NEGATIVE  NEGATIVE   Influenza B by PCR NEGATIVE NEGATIVE  Type and screen [place]     Status: None (Preliminary result)   Collection Time: 08/31/22  6:48 PM  Result Value Ref Range   ABO/RH(D) O POS    Antibody Screen NEG    Sample Expiration 09/03/2022,2359    Unit Number [id number]    Blood Component Type RED CELLS,LR    Unit division 00    Status of Unit ISSUED    Transfusion Status OK TO TRANSFUSE    Crossmatch Result      Compatible Performed at [facility], [address] [place], [place] [address]    Unit Number [id number]    Blood Component Type RED CELLS,LR    Unit division 00    Status of Unit ALLOCATED    Transfusion Status OK TO TRANSFUSE    Crossmatch Result Compatible   Prepare RBC (crossmatch)     Status: None   Collection Time: 08/31/22  6:48 PM  Result Value Ref Range   Order Confirmation      ORDER PROCESSED BY BLOOD BANK Performed at [facility], [address] [place], [place] [address]      EKG: Independently reviewed.  None    Radiological Exams on Admission: CT Angio Abd/Pel W and/or Wo Contrast  Result Date: 08/31/2022 CLINICAL DATA:  Lower GI bleed. EXAM: CTA ABDOMEN AND PELVIS WITHOUT AND WITH CONTRAST TECHNIQUE: Multidetector CT imaging of the abdomen and pelvis was performed using the standard protocol during bolus administration of intravenous contrast. Multiplanar reconstructed images and MIPs were obtained and reviewed to evaluate the vascular anatomy. RADIATION DOSE REDUCTION: This exam was performed according to the departmental dose-optimization program which includes automated exposure control, adjustment of the mA and/or kV according to patient size and/or use of iterative reconstruction technique. CONTRAST:  175m OMNIPAQUE IOHEXOL 350 MG/ML  SOLN COMPARISON:  Abdominal CTA 10/15/2019 FINDINGS: VASCULAR Aorta: Normal caliber aorta without aneurysm, dissection, vasculitis or significant stenosis. Celiac: Patent without evidence of aneurysm, dissection, vasculitis or significant stenosis. SMA: Patent without evidence of aneurysm, dissection, vasculitis or significant stenosis. Renals: 2 left and single right renal arteries, all renal arteries are patent. No acute findings or stenosis. IMA: Patent without evidence of aneurysm, dissection, vasculitis or significant stenosis. Inflow: Patent without evidence of aneurysm, dissection, vasculitis or significant stenosis. Proximal Outflow: Bilateral common femoral and visualized portions of the superficial and profunda femoral arteries are patent without evidence of aneurysm, dissection, vasculitis or significant stenosis. Veins: Venous phase imaging demonstrates patency of the portal, splenic, and superior mesenteric veins. Unremarkable appearance of the iliac veins and IVC. No acute venous findings. Review of the MIP images confirms the above findings. NON-VASCULAR Lower chest: Clear lung bases Hepatobiliary: Stable low-density liver lesions, likely representing cysts. There also enhancing foci in the left lobe of the liver that are stable and likely represent flash filling hemangiomas. These are also unchanged. No specific imaging follow-up is recommended. Unremarkable gallbladder. No biliary dilatation. Pancreas: Unremarkable. No pancreatic ductal dilatation or surrounding inflammatory changes. Spleen: Normal in size without focal abnormality. Adrenals/Urinary Tract: Normal adrenal glands. Unremarkable appearance of the kidneys. Physiologically distended gallbladder. Stomach/Bowel: There is no accumulation of contrast within the GI tract to localize source of GI bleed. No bowel obstruction or inflammation. Moderate colonic stool burden. There may be a few sigmoid colonic diverticula without diverticulitis.  Normal appendix Lymphatic: No adenopathy. Reproductive: Prostate is unremarkable. Other: No free air, free fluid, or intra-abdominal fluid  collection. Musculoskeletal: There are no acute or suspicious osseous abnormalities. IMPRESSION: 1. No accumulation of contrast within the GI tract to localize source of GI bleed. 2. Minimal sigmoid colonic diverticulosis without diverticulitis. Electronically Signed   By: Keith Rake M.D.   On: 08/31/2022 20:31   CTA pending.     Physical Exam: Vitals:   08/31/22 1706 08/31/22 1709 08/31/22 2043 08/31/22 2059  BP:   125/73 127/67  Pulse:   64 79  Resp:   18 16  Temp:  97.9 F (36.6 C) 98.2 F (36.8 C) 98 F (36.7 C)  TempSrc:  Oral Oral Oral  SpO2:   100% 100%  Weight: 90.7 kg     Height: 6' (1.829 m)      Physical Exam Vitals and nursing note reviewed.  Constitutional:      General: He is not in acute distress.    Appearance: Normal appearance. He is not ill-appearing, toxic-appearing or diaphoretic.  HENT:     Head: Normocephalic and atraumatic.     Right Ear: Hearing and external ear normal.     Left Ear: Hearing and external ear normal.     Nose: Nose normal. No nasal deformity.     Mouth/Throat:     Lips: Pink.     Mouth: Mucous membranes are moist.     Tongue: No lesions.  Eyes:     Extraocular Movements: Extraocular movements intact.     Pupils: Pupils are equal, round, and reactive to light.  Cardiovascular:     Rate and Rhythm: Normal rate and regular rhythm.     Pulses: Normal pulses.     Heart sounds: Normal heart sounds.  Pulmonary:     Effort: Pulmonary effort is normal.     Breath sounds: Normal breath sounds.  Abdominal:     General: Bowel sounds are normal. There is no distension.     Palpations: Abdomen is soft. There is no mass.     Tenderness: There is no abdominal tenderness. There is no guarding.     Hernia: No hernia is present.  Musculoskeletal:     Right lower leg: No edema.     Left lower leg: No  edema.  Skin:    General: Skin is warm.  Neurological:     General: No focal deficit present.     Mental Status: He is alert and oriented to person, place, and time.     Cranial Nerves: Cranial nerves 2-12 are intact.     Motor: Motor function is intact.  Psychiatric:        Attention and Perception: Attention normal.        Mood and Affect: Mood normal.        Speech: Speech normal.        Behavior: Behavior normal. Behavior is cooperative.        Cognition and Memory: Cognition normal.      Assessment and Plan: * Rectal bleeding Pt presenting with rectal bleeding and generalized weakness. Gi/ Gen surg consulted.  Suspect NSAID related GIB.  Type/ Cross transfuse two units. IV PPI infuion.   Acute blood loss anemia    Latest Ref Rng & Units 08/31/2022    6:24 PM 02/03/2021    4:49 PM 05/19/2020    4:44 AM  CBC  WBC 4.0 - 10.5 K/uL 4.1  4.9  4.8   Hemoglobin 13.0 - 17.0 g/dL 4.8  9.9  7.0   Hematocrit 39.0 - 52.0 % 16.9  34.8  23.5   Platelets 150 - 400 K/uL 240  258  179    2 units PRBC and goal of 8 . Will get b12 level as he has b12 def.   TOBACCO DEPENDENCE Nicotine patch. Pt counseled on cessation and risk of ongoing tobacco use- 20 minutes.     Unresulted Labs (From admission, onward)     Start     Ordered   09/01/22 0500  Comprehensive metabolic panel  Tomorrow morning,   STAT        08/31/22 2107   08/31/22 2106  HIV Antibody (routine testing w rflx)  (HIV Antibody (Routine testing w reflex) panel)  Once,   URGENT        08/31/22 2107   08/31/22 1743  Urinalysis, Routine w reflex microscopic Urine, Clean Catch  Once,   URGENT        08/31/22 1742             DVT prophylaxis:  Scd;s   Code Status:  Full code     Family Communication:  Veatrice Bourbon (Mother)  [phone] [facility] Phone)   Disposition Plan:  Home    Consults called:  GI consult: Dr.Toledo. Gen Surg : Dr Dahlia Byes.   Admission status: Inpatient.   Unit: Progressive.     Para Skeans MD [facility]  6 PM- 2 AM. Please contact me via secure Chat 6 PM-2 AM. [phone] ( Pager ) To contact the [facility] Attending or Consulting provider [place] or covering provider during after hours [place], for this patient.   Check the care team in [facility] and look for a) attending/consulting [facility] provider listed and b) the [facility] team listed Log into [email or web address] and use [place]'s universal password to access. If you do not have the password, please contact the hospital operator. Locate the [facility] provider you are looking for under [facility] and page to a number that you can be directly reached. If you still have difficulty reaching the provider, please page the [facility] (Director on Call) for the Hospitalists listed on amion for assistance. [email or web address] 08/31/2022, 9:25 PM

## 2022-08-31 NOTE — Assessment & Plan Note (Signed)
Nicotine patch. Pt counseled on cessation and risk of ongoing tobacco use- 20 minutes.

## 2022-08-31 NOTE — ED Triage Notes (Signed)
Reports weakness, thirst and fatigue X1 week. Hx low hemoglobin and receiving blood transfusion.

## 2022-08-31 NOTE — Assessment & Plan Note (Signed)
    Latest Ref Rng & Units 08/31/2022    6:24 PM 02/03/2021    4:49 PM 05/19/2020    4:44 AM  CBC  WBC 4.0 - 10.5 K/uL 4.1  4.9  4.8   Hemoglobin 13.0 - 17.0 g/dL 4.8  9.9  7.0   Hematocrit 39.0 - 52.0 % 16.9  34.8  23.5   Platelets 150 - 400 K/uL 240  258  179    2 units PRBC and goal of 8 . Will get b12 level as he has b12 def.

## 2022-08-31 NOTE — Assessment & Plan Note (Signed)
Pt presenting with rectal bleeding and generalized weakness. Gi/ Gen surg consulted.  Suspect NSAID related GIB.  Type/ Cross transfuse two units. IV PPI infuion.

## 2022-09-01 ENCOUNTER — Encounter: Payer: Self-pay | Admitting: Internal Medicine

## 2022-09-01 DIAGNOSIS — K644 Residual hemorrhoidal skin tags: Secondary | ICD-10-CM

## 2022-09-01 DIAGNOSIS — D62 Acute posthemorrhagic anemia: Secondary | ICD-10-CM

## 2022-09-01 DIAGNOSIS — K649 Unspecified hemorrhoids: Secondary | ICD-10-CM

## 2022-09-01 DIAGNOSIS — F17200 Nicotine dependence, unspecified, uncomplicated: Secondary | ICD-10-CM | POA: Diagnosis not present

## 2022-09-01 DIAGNOSIS — K625 Hemorrhage of anus and rectum: Secondary | ICD-10-CM | POA: Diagnosis not present

## 2022-09-01 DIAGNOSIS — D649 Anemia, unspecified: Secondary | ICD-10-CM | POA: Diagnosis present

## 2022-09-01 LAB — HEMOGLOBIN AND HEMATOCRIT, BLOOD
HCT: 28.2 % — ABNORMAL LOW (ref 39.0–52.0)
Hemoglobin: 8.9 g/dL — ABNORMAL LOW (ref 13.0–17.0)

## 2022-09-01 LAB — COMPREHENSIVE METABOLIC PANEL
ALT: 12 U/L (ref 0–44)
AST: 18 U/L (ref 15–41)
Albumin: 3.4 g/dL — ABNORMAL LOW (ref 3.5–5.0)
Alkaline Phosphatase: 42 U/L (ref 38–126)
Anion gap: 4 — ABNORMAL LOW (ref 5–15)
BUN: 8 mg/dL (ref 6–20)
CO2: 24 mmol/L (ref 22–32)
Calcium: 8.2 mg/dL — ABNORMAL LOW (ref 8.9–10.3)
Chloride: 109 mmol/L (ref 98–111)
Creatinine, Ser: 1.12 mg/dL (ref 0.61–1.24)
GFR, Estimated: >60 mL/min (ref >60)
Glucose, Bld: 96 mg/dL (ref 70–99)
Potassium: 3.7 mmol/L (ref 3.5–5.1)
Sodium: 137 mmol/L (ref 135–145)
Total Bilirubin: 1 mg/dL (ref 0.3–1.2)
Total Protein: 5.9 g/dL — ABNORMAL LOW (ref 6.5–8.1)

## 2022-09-01 LAB — IRON AND TIBC
Iron: 13 ug/dL — ABNORMAL LOW (ref 45–182)
Saturation Ratios: 4 % — ABNORMAL LOW (ref 17.9–39.5)
TIBC: 374 ug/dL (ref 250–450)
UIBC: 361 ug/dL

## 2022-09-01 LAB — CBC WITH DIFFERENTIAL/PLATELET
Abs Immature Granulocytes: 0.01 10*3/uL (ref 0.00–0.07)
Basophils Absolute: 0.1 10*3/uL (ref 0.0–0.1)
Basophils Relative: 1 %
Eosinophils Absolute: 0.2 10*3/uL (ref 0.0–0.5)
Eosinophils Relative: 5 %
HCT: 22.3 % — ABNORMAL LOW (ref 39.0–52.0)
Hemoglobin: 6.9 g/dL — ABNORMAL LOW (ref 13.0–17.0)
Immature Granulocytes: 0 %
Lymphocytes Relative: 50 %
Lymphs Abs: 2.2 10*3/uL (ref 0.7–4.0)
MCH: 22.7 pg — ABNORMAL LOW (ref 26.0–34.0)
MCHC: 30.9 g/dL (ref 30.0–36.0)
MCV: 73.4 fL — ABNORMAL LOW (ref 80.0–100.0)
Monocytes Absolute: 0.3 10*3/uL (ref 0.1–1.0)
Monocytes Relative: 6 %
Neutro Abs: 1.6 10*3/uL — ABNORMAL LOW (ref 1.7–7.7)
Neutrophils Relative %: 38 %
Platelets: 212 10*3/uL (ref 150–400)
RBC: 3.04 MIL/uL — ABNORMAL LOW (ref 4.22–5.81)
RDW: 18.4 % — ABNORMAL HIGH (ref 11.5–15.5)
WBC: 4.4 10*3/uL (ref 4.0–10.5)
nRBC: 0 % (ref 0.0–0.2)

## 2022-09-01 LAB — HIV ANTIBODY (ROUTINE TESTING W REFLEX): HIV Screen 4th Generation wRfx: NONREACTIVE

## 2022-09-01 LAB — PREPARE RBC (CROSSMATCH)

## 2022-09-01 LAB — FERRITIN: Ferritin: 2 ng/mL — ABNORMAL LOW (ref 24–336)

## 2022-09-01 MED ORDER — SODIUM CHLORIDE 0.9% IV SOLUTION
Freq: Once | INTRAVENOUS | Status: AC
Start: 2022-09-01 — End: 2022-09-01

## 2022-09-01 MED ORDER — SODIUM CHLORIDE 0.9 % IV SOLN
200.0000 mg | Freq: Once | INTRAVENOUS | Status: AC
  Administered 2022-09-01: 200 mg via INTRAVENOUS
  Filled 2022-09-01: qty 10
  Filled 2022-09-01: qty 200

## 2022-09-01 MED ORDER — BISACODYL 5 MG PO TBEC
20.0000 mg | DELAYED_RELEASE_TABLET | Freq: Every day | ORAL | Status: DC | PRN
Start: 2022-09-01 — End: 2022-09-02

## 2022-09-01 MED ORDER — POLYETHYLENE GLYCOL 3350 17 GM/SCOOP PO POWD
1.0000 | Freq: Once | ORAL | Status: AC
Start: 2022-09-01 — End: 2022-09-01
  Administered 2022-09-01: 255 g via ORAL
  Filled 2022-09-01: qty 255

## 2022-09-01 NOTE — Progress Notes (Signed)
Patient admitted to the unit and a skin assessment was completed by myself and Mendy RN. No skin issues noted at this time. Bed is in the lowest position, call bell is in reach, and belongings are also within reach of the patient.

## 2022-09-01 NOTE — Progress Notes (Signed)
Progress Note    Douglas Patton  SWF:[id number] DOB: June 20, 1986  DOA: 08/31/2022 PCP: Patient, No Pcp Per      Brief Narrative:    Medical records reviewed and are as summarized below:  Douglas Patton is a 36 y.o. male with medical history significant for anemia, history of bulbar duodenitis, colonic polyps, cecal polyps, diverticulosis, internal hemorrhoids on endoscopy, bronchitis, tobacco use disorder, who presented to the hospital with intermittent rectal bleeding that has gotten worse over the past few weeks.  He noticed increasing generalized weakness, easy fatigability and shortness of breath with exertion, for about 2 to 3 days preceding admission.  He was found to have severe anemia with hemoglobin of 4.8 on admission.    Assessment/Plan:   Principal Problem:   Rectal bleeding Active Problems:   Acute blood loss anemia   TOBACCO DEPENDENCE   Hemorrhoids   Body mass index is 23.23 kg/m.  Severe iron deficiency anemia from acute on chronic blood loss: s/p transfusion with 2 units of PRBCs.  Hemoglobin improved from 4.2-6.9.  Transfuse additional unit of packed red blood cells.  This will be followed with IV iron infusion.  Discontinue IV fluids.  Rectal bleeding in a patient with history of duodenitis, diverticulosis, internal hemorrhoids, colonic polyps on prior colonoscopy in 2021: Continue IV Protonix.  Gastroenterologist has been consulted to assist with management.  Tobacco use disorder: Counseled to quit smoking cigarettes   Diet Order             Diet NPO time specified  Diet effective midnight                            Consultants: Gastroenterologist General surgeon  Procedures: None    Medications:    nicotine  21 mg Transdermal Daily   [START ON 09/04/2022] pantoprazole  40 mg Intravenous Q12H   Continuous Infusions:  lactated ringers 100 mL/hr at 09/01/22 1012   pantoprazole 8 mg/hr (09/01/22 0757)      Anti-infectives (From admission, onward)    None              Family Communication/Anticipated D/C date and plan/Code Status   DVT prophylaxis: SCDs Start: 08/31/22 2106     Code Status: Full Code  Family Communication: None Disposition Plan: Plan to discharge home tomorrow   Status is: Inpatient Remains inpatient appropriate because: Severe anemia requiring blood transfusion and H&H monitoring       Subjective:   Interval events noted.  No rectal bleeding, abdominal pain or hematemesis today.  Objective:    Vitals:   09/01/22 1125 09/01/22 1142 09/01/22 1215 09/01/22 1229  BP: 136/76 126/75 126/71 126/71  Pulse: 72 62 66 64  Resp: '16 18 18   '$ Temp: 98.3 F (36.8 C) 98.2 F (36.8 C) 98.4 F (36.9 C) 98 F (36.7 C)  TempSrc: Oral Oral  Oral  SpO2: 100% 100%  100%  Weight:      Height:       No data found.   Intake/Output Summary (Last 24 hours) at 09/01/2022 1326 Last data filed at 09/01/2022 0757 Gross per 24 hour  Intake 1053.5 ml  Output --  Net 1053.5 ml   Filed Weights   08/31/22 1706 09/01/22 0523  Weight: 90.7 kg 77.7 kg    Exam:  GEN: NAD SKIN: No rash EYES: Pale, anicteric ENT: MMM CV: RRR PULM: CTA B ABD: soft, ND, NT, +BS  CNS: AAO x 3, non focal EXT: No edema or tenderness        Data Reviewed:   I have personally reviewed following labs and imaging studies:  Labs: Labs show the following:   Basic Metabolic Panel: Recent Labs  Lab 08/31/22 1824 09/01/22 0248  NA 138 137  K 3.7 3.7  CL 109 109  CO2 26 24  GLUCOSE 91 96  BUN 9 8  CREATININE 1.05 1.12  CALCIUM 8.2* 8.2*   GFR Estimated Creatinine Clearance: 101 mL/min (by C-G formula based on SCr of 1.12 mg/dL). Liver Function Tests: Recent Labs  Lab 08/31/22 1824 09/01/22 0248  AST 19 18  ALT 12 12  ALKPHOS 43 42  BILITOT 0.7 1.0  PROT 6.1* 5.9*  ALBUMIN 3.6 3.4*   No results for input(s): "LIPASE", "AMYLASE" in the last 168 hours. No  results for input(s): "AMMONIA" in the last 168 hours. Coagulation profile No results for input(s): "INR", "PROTIME" in the last 168 hours.  CBC: Recent Labs  Lab 08/31/22 1824 09/01/22 0248  WBC 4.1 4.4  NEUTROABS 1.6* 1.6*  HGB 4.8* 6.9*  HCT 16.9* 22.3*  MCV 70.4* 73.4*  PLT 240 212   Cardiac Enzymes: No results for input(s): "CKTOTAL", "CKMB", "CKMBINDEX", "TROPONINI" in the last 168 hours. BNP (last 3 results) No results for input(s): "PROBNP" in the last 8760 hours. CBG: No results for input(s): "GLUCAP" in the last 168 hours. D-Dimer: No results for input(s): "DDIMER" in the last 72 hours. Hgb A1c: No results for input(s): "HGBA1C" in the last 72 hours. Lipid Profile: No results for input(s): "CHOL", "HDL", "LDLCALC", "TRIG", "CHOLHDL", "LDLDIRECT" in the last 72 hours. Thyroid function studies: No results for input(s): "TSH", "T4TOTAL", "T3FREE", "THYROIDAB" in the last 72 hours.  Invalid input(s): "FREET3" Anemia work up: Recent Labs    09/01/22 0248  FERRITIN 2*  TIBC 374  IRON 13*   Sepsis Labs: Recent Labs  Lab 08/31/22 1824 09/01/22 0248  WBC 4.1 4.4    Microbiology Recent Results (from the past 240 hour(s))  Resp Panel by RT-PCR (Flu A&B, Covid) Anterior Nasal Swab     Status: None   Collection Time: 08/31/22  6:24 PM   Specimen: Anterior Nasal Swab  Result Value Ref Range Status   SARS Coronavirus 2 by RT PCR NEGATIVE NEGATIVE Final    Comment: (NOTE) SARS-CoV-2 target nucleic acids are NOT DETECTED.  The SARS-CoV-2 RNA is generally detectable in upper respiratory specimens during the acute phase of infection. The lowest concentration of SARS-CoV-2 viral copies this assay can detect is 138 copies/mL. A negative result does not preclude SARS-Cov-2 infection and should not be used as the sole basis for treatment or other patient management decisions. A negative result may occur with  improper specimen collection/handling, submission of  specimen other than nasopharyngeal swab, presence of viral mutation(s) within the areas targeted by this assay, and inadequate number of viral copies(<138 copies/mL). A negative result must be combined with clinical observations, patient history, and epidemiological information. The expected result is Negative.  Fact Sheet for Patients:  EntrepreneurPulse.com.au  Fact Sheet for Healthcare Providers:  IncredibleEmployment.be  This test is no t yet approved or cleared by the [place] FDA and  has been authorized for detection and/or diagnosis of SARS-CoV-2 by FDA under an Emergency Use Authorization (EUA). This EUA will remain  in effect (meaning this test can be used) for the duration of the COVID-19 declaration under Section 564(b)(1) of the Act, 21 U.S.C.section  360bbb-3(b)(1), unless the authorization is terminated  or revoked sooner.       Influenza A by PCR NEGATIVE NEGATIVE Final   Influenza B by PCR NEGATIVE NEGATIVE Final    Comment: (NOTE) The Xpert Xpress SARS-CoV-2/FLU/RSV plus assay is intended as an aid in the diagnosis of influenza from Nasopharyngeal swab specimens and should not be used as a sole basis for treatment. Nasal washings and aspirates are unacceptable for Xpert Xpress SARS-CoV-2/FLU/RSV testing.  Fact Sheet for Patients: EntrepreneurPulse.com.au  Fact Sheet for Healthcare Providers: IncredibleEmployment.be  This test is not yet approved or cleared by the [place] FDA and has been authorized for detection and/or diagnosis of SARS-CoV-2 by FDA under an Emergency Use Authorization (EUA). This EUA will remain in effect (meaning this test can be used) for the duration of the COVID-19 declaration under Section 564(b)(1) of the Act, 21 U.S.C. section 360bbb-3(b)(1), unless the authorization is terminated or revoked.  Performed at [facility], [address] [place], [place] [address]     Procedures and diagnostic studies:  CT Angio Abd/Pel W and/or Wo Contrast  Result Date: 08/31/2022 CLINICAL DATA:  Lower GI bleed. EXAM: CTA ABDOMEN AND PELVIS WITHOUT AND WITH CONTRAST TECHNIQUE: Multidetector CT imaging of the abdomen and pelvis was performed using the standard protocol during bolus administration of intravenous contrast. Multiplanar reconstructed images and MIPs were obtained and reviewed to evaluate the vascular anatomy. RADIATION DOSE REDUCTION: This exam was performed according to the departmental dose-optimization program which includes automated exposure control, adjustment of the mA and/or kV according to patient size and/or use of iterative reconstruction technique. CONTRAST:  171m OMNIPAQUE IOHEXOL 350 MG/ML SOLN COMPARISON:  Abdominal CTA 10/15/2019 FINDINGS: VASCULAR Aorta: Normal caliber aorta without aneurysm, dissection, vasculitis or significant stenosis. Celiac: Patent without evidence of aneurysm, dissection, vasculitis or significant stenosis. SMA: Patent without evidence of aneurysm, dissection, vasculitis or significant stenosis. Renals: 2 left and single right renal arteries, all renal arteries are patent. No acute findings or stenosis. IMA: Patent without evidence of aneurysm, dissection, vasculitis or significant stenosis. Inflow: Patent without evidence of aneurysm, dissection, vasculitis or significant stenosis. Proximal Outflow: Bilateral common femoral and visualized portions of the superficial and profunda femoral arteries are patent without evidence of aneurysm, dissection, vasculitis or significant stenosis. Veins: Venous phase imaging demonstrates patency of the portal, splenic, and superior mesenteric veins. Unremarkable appearance of the iliac veins and IVC. No acute venous findings. Review of the MIP images confirms the above findings. NON-VASCULAR Lower chest: Clear lung bases Hepatobiliary: Stable low-density liver lesions,  likely representing cysts. There also enhancing foci in the left lobe of the liver that are stable and likely represent flash filling hemangiomas. These are also unchanged. No specific imaging follow-up is recommended. Unremarkable gallbladder. No biliary dilatation. Pancreas: Unremarkable. No pancreatic ductal dilatation or surrounding inflammatory changes. Spleen: Normal in size without focal abnormality. Adrenals/Urinary Tract: Normal adrenal glands. Unremarkable appearance of the kidneys. Physiologically distended gallbladder. Stomach/Bowel: There is no accumulation of contrast within the GI tract to localize source of GI bleed. No bowel obstruction or inflammation. Moderate colonic stool burden. There may be a few sigmoid colonic diverticula without diverticulitis. Normal appendix Lymphatic: No adenopathy. Reproductive: Prostate is unremarkable. Other: No free air, free fluid, or intra-abdominal fluid collection. Musculoskeletal: There are no acute or suspicious osseous abnormalities. IMPRESSION: 1. No accumulation of contrast within the GI tract to localize source of GI bleed. 2. Minimal sigmoid colonic diverticulosis without diverticulitis. Electronically Signed   By: MThreasa Beards  Sanford M.D.   On: 08/31/2022 20:31               LOS: 1 day   Ginevra Tacker  [facility]   Pager on [email or web address]. If 7PM-7AM, please contact night-coverage at [email or web address]     09/01/2022, 1:26 PM

## 2022-09-01 NOTE — Consult Note (Signed)
[facility] GI Inpatient Consult Note   Kathline Magic, M.D.  Reason for Consult: rectal bleeding, GI blood loss anemia   Attending Requesting Consult: Lucillie Garfinkel, M.D.   History of Present Illness: Douglas Patton is a 36 y.o. male  Past Medical History:  Past Medical History:  Diagnosis Date   Anemia    Bronchitis    Tobacco abuse     Problem List: Patient Active Problem List   Diagnosis Date Noted   Hemorrhoids    Acute blood loss anemia 08/31/2022   Rectal bleeding 08/31/2022   Symptomatic anemia 03/02/2018   TOBACCO DEPENDENCE 02/24/2007   RHINITIS, ALLERGIC 02/24/2007   ACNE 02/24/2007    Past Surgical History: Past Surgical History:  Procedure Laterality Date   COLONOSCOPY N/A 03/04/2018   Procedure: COLONOSCOPY;  Surgeon: Lin Landsman, MD;  Location: [facility] ENDOSCOPY;  Service: Gastroenterology;  Laterality: N/A;   COLONOSCOPY N/A 05/19/2020   Procedure: COLONOSCOPY;  Surgeon: Avilyn Virtue, Benay Pike, MD;  Location: [facility] ENDOSCOPY;  Service: Gastroenterology;  Laterality: N/A;   ESOPHAGOGASTRODUODENOSCOPY N/A 03/04/2018   Procedure: ESOPHAGOGASTRODUODENOSCOPY (EGD);  Surgeon: Lin Landsman, MD;  Location: [facility] ENDOSCOPY;  Service: Gastroenterology;  Laterality: N/A;   ESOPHAGOGASTRODUODENOSCOPY N/A 05/19/2020   Procedure: ESOPHAGOGASTRODUODENOSCOPY (EGD);  Surgeon: Daray Polgar, Benay Pike, MD;  Location: [facility] ENDOSCOPY;  Service: Gastroenterology;  Laterality: N/A;   NO PAST SURGERIES      Allergies: No Known Allergies  Home Medications: Medications Prior to Admission  Medication Sig Dispense Refill Last Dose   cyclobenzaprine (FLEXERIL) 10 MG tablet Take 1 tablet (10 mg total) by mouth 3 (three) times daily as needed. (Patient not taking: Reported on 08/31/2022) 30 tablet 0 Not Taking   iron polysaccharides (NIFEREX) 150 MG capsule Take 1 capsule (150 mg total) by mouth 2 (two) times daily. (Patient not taking: Reported on 08/31/2022) 120 capsule 0  Not Taking   methylPREDNISolone (MEDROL DOSEPAK) 4 MG TBPK tablet Take 6 pills on day one then decrease by 1 pill each day (Patient not taking: Reported on 08/31/2022) 21 tablet 0 Completed Course   pantoprazole (PROTONIX) 40 MG tablet Take 1 tablet (40 mg total) by mouth 2 (two) times daily before a meal for 14 days. 28 tablet 0    vitamin B-12 (CYANOCOBALAMIN) 1000 MCG tablet Take 1 tablet (1,000 mcg total) by mouth daily. (Patient not taking: Reported on 08/31/2022) 30 tablet 0 Not Taking   Home medication reconciliation was completed with the patient.   Scheduled Inpatient Medications:    nicotine  21 mg Transdermal Daily   [START ON 09/04/2022] pantoprazole  40 mg Intravenous Q12H    Continuous Inpatient Infusions:    lactated ringers 100 mL/hr at 09/01/22 1012   pantoprazole 8 mg/hr (09/01/22 0757)    PRN Inpatient Medications:  ondansetron **OR** ondansetron (ZOFRAN) IV  Family History: family history includes CVA in his father; Diabetes Mellitus II in his mother.   GI Family History: Negative  Social History:   reports that he has been smoking cigarettes. He has never used smokeless tobacco. He reports current alcohol use. He reports that he does not currently use drugs after having used the following drugs: Marijuana. The patient denies ETOH, tobacco, or drug use.    Review of Systems: Review of Systems - Negative except HPI.  Physical Examination: BP 126/71   Pulse 64   Temp 98 F (36.7 C) (Oral)   Resp 18   Ht 6' (1.829 m)   Wt 77.7 kg  SpO2 100%   BMI 23.23 kg/m  Physical Exam Constitutional:      General: He is not in acute distress.    Appearance: He is normal weight. He is not toxic-appearing.  HENT:     Head: Normocephalic.     Nose: Nose normal.     Mouth/Throat:     Mouth: Mucous membranes are moist.     Pharynx: Oropharynx is clear.  Eyes:     Extraocular Movements: Extraocular movements intact.     Conjunctiva/sclera: Conjunctivae normal.      Pupils: Pupils are equal, round, and reactive to light.  Cardiovascular:     Rate and Rhythm: Normal rate.     Pulses: Normal pulses.  Pulmonary:     Effort: Pulmonary effort is normal.  Abdominal:     General: Abdomen is flat. There is no distension.     Palpations: Abdomen is soft. There is no mass.     Tenderness: There is no abdominal tenderness.     Hernia: No hernia is present.  Skin:    General: Skin is warm and dry.     Coloration: Skin is not jaundiced or pale.  Neurological:     General: No focal deficit present.     Mental Status: He is alert.  Psychiatric:        Mood and Affect: Mood normal.        Thought Content: Thought content normal.     Data: Lab Results  Component Value Date   WBC 4.4 09/01/2022   HGB 6.9 (L) 09/01/2022   HCT 22.3 (L) 09/01/2022   MCV 73.4 (L) 09/01/2022   PLT 212 09/01/2022   Recent Labs  Lab 08/31/22 1824 09/01/22 0248  HGB 4.8* 6.9*   Lab Results  Component Value Date   NA 137 09/01/2022   K 3.7 09/01/2022   CL 109 09/01/2022   CO2 24 09/01/2022   BUN 8 09/01/2022   CREATININE 1.12 09/01/2022   Lab Results  Component Value Date   ALT 12 09/01/2022   AST 18 09/01/2022   ALKPHOS 42 09/01/2022   BILITOT 1.0 09/01/2022   No results for input(s): "APTT", "INR", "PTT" in the last 168 hours.    Latest Ref Rng & Units 09/01/2022    2:48 AM 08/31/2022    6:24 PM 02/03/2021    4:49 PM  CBC  WBC 4.0 - 10.5 K/uL 4.4  4.1  4.9   Hemoglobin 13.0 - 17.0 g/dL 6.9  4.8  9.9   Hematocrit 39.0 - 52.0 % 22.3  16.9  34.8   Platelets 150 - 400 K/uL 212  240  258     STUDIES: CT Angio Abd/Pel W and/or Wo Contrast  Result Date: 08/31/2022 CLINICAL DATA:  Lower GI bleed. EXAM: CTA ABDOMEN AND PELVIS WITHOUT AND WITH CONTRAST TECHNIQUE: Multidetector CT imaging of the abdomen and pelvis was performed using the standard protocol during bolus administration of intravenous contrast. Multiplanar reconstructed images and MIPs were obtained and  reviewed to evaluate the vascular anatomy. RADIATION DOSE REDUCTION: This exam was performed according to the departmental dose-optimization program which includes automated exposure control, adjustment of the mA and/or kV according to patient size and/or use of iterative reconstruction technique. CONTRAST:  122m OMNIPAQUE IOHEXOL 350 MG/ML SOLN COMPARISON:  Abdominal CTA 10/15/2019 FINDINGS: VASCULAR Aorta: Normal caliber aorta without aneurysm, dissection, vasculitis or significant stenosis. Celiac: Patent without evidence of aneurysm, dissection, vasculitis or significant stenosis. SMA: Patent without evidence of aneurysm, dissection, vasculitis  or significant stenosis. Renals: 2 left and single right renal arteries, all renal arteries are patent. No acute findings or stenosis. IMA: Patent without evidence of aneurysm, dissection, vasculitis or significant stenosis. Inflow: Patent without evidence of aneurysm, dissection, vasculitis or significant stenosis. Proximal Outflow: Bilateral common femoral and visualized portions of the superficial and profunda femoral arteries are patent without evidence of aneurysm, dissection, vasculitis or significant stenosis. Veins: Venous phase imaging demonstrates patency of the portal, splenic, and superior mesenteric veins. Unremarkable appearance of the iliac veins and IVC. No acute venous findings. Review of the MIP images confirms the above findings. NON-VASCULAR Lower chest: Clear lung bases Hepatobiliary: Stable low-density liver lesions, likely representing cysts. There also enhancing foci in the left lobe of the liver that are stable and likely represent flash filling hemangiomas. These are also unchanged. No specific imaging follow-up is recommended. Unremarkable gallbladder. No biliary dilatation. Pancreas: Unremarkable. No pancreatic ductal dilatation or surrounding inflammatory changes. Spleen: Normal in size without focal abnormality. Adrenals/Urinary Tract:  Normal adrenal glands. Unremarkable appearance of the kidneys. Physiologically distended gallbladder. Stomach/Bowel: There is no accumulation of contrast within the GI tract to localize source of GI bleed. No bowel obstruction or inflammation. Moderate colonic stool burden. There may be a few sigmoid colonic diverticula without diverticulitis. Normal appendix Lymphatic: No adenopathy. Reproductive: Prostate is unremarkable. Other: No free air, free fluid, or intra-abdominal fluid collection. Musculoskeletal: There are no acute or suspicious osseous abnormalities. IMPRESSION: 1. No accumulation of contrast within the GI tract to localize source of GI bleed. 2. Minimal sigmoid colonic diverticulosis without diverticulitis. Electronically Signed   By: Keith Rake M.D.   On: 08/31/2022 20:31   '@IMAGES'$ @  Assessment:  Anemia secondary to subacute gastrointestinal bleeding.  History of Grade III internal hemoorrhoids. Hematochezia.     Recommendations:  EGD and colonoscopy in AM. The patient understands the nature of the planned procedure, indications, risks, alternatives and potential complications including but not limited to bleeding, infection, perforation, damage to internal organs and possible oversedation/side effects from anesthesia. The patient agrees and gives consent to proceed.  Please refer to procedure notes for findings, recommendations and patient disposition/instructions. Consider hemorrhoidectomy if no other sources of bleeding identified.  Thank you for the consult. Please call with questions or concerns.  Olean Ree, "Lanny Hurst MD [facility] [place], [place] [address] [phone]  09/01/2022 12:58 PM

## 2022-09-01 NOTE — H&P (View-Only) (Signed)
[facility] GI Inpatient Consult Note   Kathline Magic, M.D.  Reason for Consult: rectal bleeding, GI blood loss anemia   Attending Requesting Consult: Lucillie Garfinkel, M.D.   History of Present Illness: Douglas Patton is a 36 y.o. male  Past Medical History:  Past Medical History:  Diagnosis Date   Anemia    Bronchitis    Tobacco abuse     Problem List: Patient Active Problem List   Diagnosis Date Noted   Hemorrhoids    Acute blood loss anemia 08/31/2022   Rectal bleeding 08/31/2022   Symptomatic anemia 03/02/2018   TOBACCO DEPENDENCE 02/24/2007   RHINITIS, ALLERGIC 02/24/2007   ACNE 02/24/2007    Past Surgical History: Past Surgical History:  Procedure Laterality Date   COLONOSCOPY N/A 03/04/2018   Procedure: COLONOSCOPY;  Surgeon: Lin Landsman, MD;  Location: [facility] ENDOSCOPY;  Service: Gastroenterology;  Laterality: N/A;   COLONOSCOPY N/A 05/19/2020   Procedure: COLONOSCOPY;  Surgeon: Emmanuela Ghazi, Benay Pike, MD;  Location: [facility] ENDOSCOPY;  Service: Gastroenterology;  Laterality: N/A;   ESOPHAGOGASTRODUODENOSCOPY N/A 03/04/2018   Procedure: ESOPHAGOGASTRODUODENOSCOPY (EGD);  Surgeon: Lin Landsman, MD;  Location: [facility] ENDOSCOPY;  Service: Gastroenterology;  Laterality: N/A;   ESOPHAGOGASTRODUODENOSCOPY N/A 05/19/2020   Procedure: ESOPHAGOGASTRODUODENOSCOPY (EGD);  Surgeon: Khyleigh Furney, Benay Pike, MD;  Location: [facility] ENDOSCOPY;  Service: Gastroenterology;  Laterality: N/A;   NO PAST SURGERIES      Allergies: No Known Allergies  Home Medications: Medications Prior to Admission  Medication Sig Dispense Refill Last Dose   cyclobenzaprine (FLEXERIL) 10 MG tablet Take 1 tablet (10 mg total) by mouth 3 (three) times daily as needed. (Patient not taking: Reported on 08/31/2022) 30 tablet 0 Not Taking   iron polysaccharides (NIFEREX) 150 MG capsule Take 1 capsule (150 mg total) by mouth 2 (two) times daily. (Patient not taking: Reported on 08/31/2022) 120 capsule 0  Not Taking   methylPREDNISolone (MEDROL DOSEPAK) 4 MG TBPK tablet Take 6 pills on day one then decrease by 1 pill each day (Patient not taking: Reported on 08/31/2022) 21 tablet 0 Completed Course   pantoprazole (PROTONIX) 40 MG tablet Take 1 tablet (40 mg total) by mouth 2 (two) times daily before a meal for 14 days. 28 tablet 0    vitamin B-12 (CYANOCOBALAMIN) 1000 MCG tablet Take 1 tablet (1,000 mcg total) by mouth daily. (Patient not taking: Reported on 08/31/2022) 30 tablet 0 Not Taking   Home medication reconciliation was completed with the patient.   Scheduled Inpatient Medications:    nicotine  21 mg Transdermal Daily   [START ON 09/04/2022] pantoprazole  40 mg Intravenous Q12H    Continuous Inpatient Infusions:    lactated ringers 100 mL/hr at 09/01/22 1012   pantoprazole 8 mg/hr (09/01/22 0757)    PRN Inpatient Medications:  ondansetron **OR** ondansetron (ZOFRAN) IV  Family History: family history includes CVA in his father; Diabetes Mellitus II in his mother.   GI Family History: Negative  Social History:   reports that he has been smoking cigarettes. He has never used smokeless tobacco. He reports current alcohol use. He reports that he does not currently use drugs after having used the following drugs: Marijuana. The patient denies ETOH, tobacco, or drug use.    Review of Systems: Review of Systems - Negative except HPI.  Physical Examination: BP 126/71   Pulse 64   Temp 98 F (36.7 C) (Oral)   Resp 18   Ht 6' (1.829 m)   Wt 77.7 kg  SpO2 100%   BMI 23.23 kg/m  Physical Exam Constitutional:      General: He is not in acute distress.    Appearance: He is normal weight. He is not toxic-appearing.  HENT:     Head: Normocephalic.     Nose: Nose normal.     Mouth/Throat:     Mouth: Mucous membranes are moist.     Pharynx: Oropharynx is clear.  Eyes:     Extraocular Movements: Extraocular movements intact.     Conjunctiva/sclera: Conjunctivae normal.      Pupils: Pupils are equal, round, and reactive to light.  Cardiovascular:     Rate and Rhythm: Normal rate.     Pulses: Normal pulses.  Pulmonary:     Effort: Pulmonary effort is normal.  Abdominal:     General: Abdomen is flat. There is no distension.     Palpations: Abdomen is soft. There is no mass.     Tenderness: There is no abdominal tenderness.     Hernia: No hernia is present.  Skin:    General: Skin is warm and dry.     Coloration: Skin is not jaundiced or pale.  Neurological:     General: No focal deficit present.     Mental Status: He is alert.  Psychiatric:        Mood and Affect: Mood normal.        Thought Content: Thought content normal.     Data: Lab Results  Component Value Date   WBC 4.4 09/01/2022   HGB 6.9 (L) 09/01/2022   HCT 22.3 (L) 09/01/2022   MCV 73.4 (L) 09/01/2022   PLT 212 09/01/2022   Recent Labs  Lab 08/31/22 1824 09/01/22 0248  HGB 4.8* 6.9*   Lab Results  Component Value Date   NA 137 09/01/2022   K 3.7 09/01/2022   CL 109 09/01/2022   CO2 24 09/01/2022   BUN 8 09/01/2022   CREATININE 1.12 09/01/2022   Lab Results  Component Value Date   ALT 12 09/01/2022   AST 18 09/01/2022   ALKPHOS 42 09/01/2022   BILITOT 1.0 09/01/2022   No results for input(s): "APTT", "INR", "PTT" in the last 168 hours.    Latest Ref Rng & Units 09/01/2022    2:48 AM 08/31/2022    6:24 PM 02/03/2021    4:49 PM  CBC  WBC 4.0 - 10.5 K/uL 4.4  4.1  4.9   Hemoglobin 13.0 - 17.0 g/dL 6.9  4.8  9.9   Hematocrit 39.0 - 52.0 % 22.3  16.9  34.8   Platelets 150 - 400 K/uL 212  240  258     STUDIES: CT Angio Abd/Pel W and/or Wo Contrast  Result Date: 08/31/2022 CLINICAL DATA:  Lower GI bleed. EXAM: CTA ABDOMEN AND PELVIS WITHOUT AND WITH CONTRAST TECHNIQUE: Multidetector CT imaging of the abdomen and pelvis was performed using the standard protocol during bolus administration of intravenous contrast. Multiplanar reconstructed images and MIPs were obtained and  reviewed to evaluate the vascular anatomy. RADIATION DOSE REDUCTION: This exam was performed according to the departmental dose-optimization program which includes automated exposure control, adjustment of the mA and/or kV according to patient size and/or use of iterative reconstruction technique. CONTRAST:  169m OMNIPAQUE IOHEXOL 350 MG/ML SOLN COMPARISON:  Abdominal CTA 10/15/2019 FINDINGS: VASCULAR Aorta: Normal caliber aorta without aneurysm, dissection, vasculitis or significant stenosis. Celiac: Patent without evidence of aneurysm, dissection, vasculitis or significant stenosis. SMA: Patent without evidence of aneurysm, dissection, vasculitis  or significant stenosis. Renals: 2 left and single right renal arteries, all renal arteries are patent. No acute findings or stenosis. IMA: Patent without evidence of aneurysm, dissection, vasculitis or significant stenosis. Inflow: Patent without evidence of aneurysm, dissection, vasculitis or significant stenosis. Proximal Outflow: Bilateral common femoral and visualized portions of the superficial and profunda femoral arteries are patent without evidence of aneurysm, dissection, vasculitis or significant stenosis. Veins: Venous phase imaging demonstrates patency of the portal, splenic, and superior mesenteric veins. Unremarkable appearance of the iliac veins and IVC. No acute venous findings. Review of the MIP images confirms the above findings. NON-VASCULAR Lower chest: Clear lung bases Hepatobiliary: Stable low-density liver lesions, likely representing cysts. There also enhancing foci in the left lobe of the liver that are stable and likely represent flash filling hemangiomas. These are also unchanged. No specific imaging follow-up is recommended. Unremarkable gallbladder. No biliary dilatation. Pancreas: Unremarkable. No pancreatic ductal dilatation or surrounding inflammatory changes. Spleen: Normal in size without focal abnormality. Adrenals/Urinary Tract:  Normal adrenal glands. Unremarkable appearance of the kidneys. Physiologically distended gallbladder. Stomach/Bowel: There is no accumulation of contrast within the GI tract to localize source of GI bleed. No bowel obstruction or inflammation. Moderate colonic stool burden. There may be a few sigmoid colonic diverticula without diverticulitis. Normal appendix Lymphatic: No adenopathy. Reproductive: Prostate is unremarkable. Other: No free air, free fluid, or intra-abdominal fluid collection. Musculoskeletal: There are no acute or suspicious osseous abnormalities. IMPRESSION: 1. No accumulation of contrast within the GI tract to localize source of GI bleed. 2. Minimal sigmoid colonic diverticulosis without diverticulitis. Electronically Signed   By: Keith Rake M.D.   On: 08/31/2022 20:31   '@IMAGES'$ @  Assessment:  Anemia secondary to subacute gastrointestinal bleeding.  History of Grade III internal hemoorrhoids. Hematochezia.     Recommendations:  EGD and colonoscopy in AM. The patient understands the nature of the planned procedure, indications, risks, alternatives and potential complications including but not limited to bleeding, infection, perforation, damage to internal organs and possible oversedation/side effects from anesthesia. The patient agrees and gives consent to proceed.  Please refer to procedure notes for findings, recommendations and patient disposition/instructions. Consider hemorrhoidectomy if no other sources of bleeding identified.  Thank you for the consult. Please call with questions or concerns.  Olean Ree, "Lanny Hurst MD [facility] [place],  [phone]  09/01/2022 12:58 PM

## 2022-09-01 NOTE — Consult Note (Signed)
[facility] SURGICAL CONSULTATION NOTE (initial) - cpt: 15400   HISTORY OF PRESENT ILLNESS (HPI):  36 y.o. male presented to [facility] ED yesterday for evaluation of weakness. Patient reported progressively worsening weakness over the course of the last month or so. He notes over this time has also had intermittent bright red blood with stooling and when he wipes. He denied any abdominal pain, nausea, emesis, fever, chills, CP, SOB, or urinary complaints. He does have a known history of likely iron-deficiency anemia. Has known hemorrhoids; never sought treatment. Most colonoscopy was in May of 2020 with Dr Alice Reichert which showed small polyps and external hemorrhoids. Upper endoscopy at that time was negative. Work up in the ED revealed a Hgb to 4.8 (baseline anemia; has been 4.9 two years ago - reports "the same thing happened"), WBC normal at 4.1, renal function normal with sCr - 1.05. He did also undergo CTA which did not show any evidence of GI bleeding. He was admitted to the medicine service. He was transfused 2 units pRBCs and Hgb responded to 6.9 this morning. GI also consulted; pending.   Surgery is consulted by emergency physician Dr.  Lucillie Garfinkel, MD in this context for evaluation and management of GI bleeding in setting of known hemorrhoids.   PAST MEDICAL HISTORY (PMH):  Past Medical History:  Diagnosis Date   Anemia    Bronchitis    Tobacco abuse      PAST SURGICAL HISTORY ([place]):  Past Surgical History:  Procedure Laterality Date   COLONOSCOPY N/A 03/04/2018   Procedure: COLONOSCOPY;  Surgeon: Lin Landsman, MD;  Location: [facility] ENDOSCOPY;  Service: Gastroenterology;  Laterality: N/A;   COLONOSCOPY N/A 05/19/2020   Procedure: COLONOSCOPY;  Surgeon: Toledo, Benay Pike, MD;  Location: [facility] ENDOSCOPY;  Service: Gastroenterology;  Laterality: N/A;   ESOPHAGOGASTRODUODENOSCOPY N/A 03/04/2018   Procedure: ESOPHAGOGASTRODUODENOSCOPY (EGD);  Surgeon: Lin Landsman, MD;   Location: [facility] ENDOSCOPY;  Service: Gastroenterology;  Laterality: N/A;   ESOPHAGOGASTRODUODENOSCOPY N/A 05/19/2020   Procedure: ESOPHAGOGASTRODUODENOSCOPY (EGD);  Surgeon: Toledo, Benay Pike, MD;  Location: [facility] ENDOSCOPY;  Service: Gastroenterology;  Laterality: N/A;   NO PAST SURGERIES       MEDICATIONS:  Prior to Admission medications   Medication Sig Start Date End Date Taking? Authorizing Provider  cyclobenzaprine (FLEXERIL) 10 MG tablet Take 1 tablet (10 mg total) by mouth 3 (three) times daily as needed. Patient not taking: Reported on 08/31/2022 02/03/21   Versie Starks, PA-C  iron polysaccharides (NIFEREX) 150 MG capsule Take 1 capsule (150 mg total) by mouth 2 (two) times daily. Patient not taking: Reported on 08/31/2022 05/19/20   Lavina Hamman, MD  methylPREDNISolone (MEDROL DOSEPAK) 4 MG TBPK tablet Take 6 pills on day one then decrease by 1 pill each day Patient not taking: Reported on 08/31/2022 02/03/21   Versie Starks, PA-C  pantoprazole (PROTONIX) 40 MG tablet Take 1 tablet (40 mg total) by mouth 2 (two) times daily before a meal for 14 days. 05/19/20 06/02/20  Lavina Hamman, MD  vitamin B-12 (CYANOCOBALAMIN) 1000 MCG tablet Take 1 tablet (1,000 mcg total) by mouth daily. Patient not taking: Reported on 08/31/2022 05/19/20   Lavina Hamman, MD     ALLERGIES:  No Known Allergies   SOCIAL HISTORY:  Social History   Socioeconomic History   Marital status: Single    Spouse name: Not on file   Number of children: Not on file   Years of education: Not on file  Highest education level: Not on file  Occupational History   Not on file  Tobacco Use   Smoking status: Every Day    Types: Cigarettes   Smokeless tobacco: Never  Vaping Use   Vaping Use: Some days  Substance and Sexual Activity   Alcohol use: Yes    Comment: occ   Drug use: Not Currently    Types: Marijuana   Sexual activity: Yes    Partners: Female  Other Topics Concern   Not on file  Social History  Narrative   Not on file   Social Determinants of Health   Financial Resource Strain: Not on file  Food Insecurity: Not on file  Transportation Needs: Not on file  Physical Activity: Not on file  Stress: Not on file  Social Connections: Not on file  Intimate Partner Violence: Not on file     FAMILY HISTORY:  Family History  Problem Relation Age of Onset   Diabetes Mellitus II Mother    CVA Father       REVIEW OF SYSTEMS:  Review of Systems  Constitutional:  Positive for malaise/fatigue. Negative for chills and fever.  Respiratory:  Negative for cough and shortness of breath.   Cardiovascular:  Negative for chest pain and palpitations.  Gastrointestinal:  Positive for blood in stool. Negative for abdominal pain, diarrhea, nausea and vomiting.  Genitourinary:  Negative for dysuria and urgency.  Neurological:  Positive for weakness and headaches. Negative for dizziness.  All other systems reviewed and are negative.   VITAL SIGNS:  Temp:  [97.8 F (36.6 C)-98.2 F (36.8 C)] 98.1 F (36.7 C) (09/05 0523) Pulse Rate:  [60-88] 60 (09/05 0523) Resp:  [16-20] 18 (09/05 0523) BP: (117-145)/(61-83) 123/61 (09/05 0523) SpO2:  [94 %-100 %] 100 % (09/05 0523) Weight:  [77.7 kg-90.7 kg] 77.7 kg (09/05 0523)     Height: 6' (182.9 cm) Weight: 77.7 kg BMI (Calculated): 23.23   INTAKE/OUTPUT:  09/04 0701 - 09/05 0700 In: 804.2 [I.V.:404.2; Blood:300; IV Piggyback:100] Out: -   PHYSICAL EXAM:  Physical Exam Vitals and nursing note reviewed.  Constitutional:      General: He is not in acute distress.    Appearance: Normal appearance. He is normal weight. He is not ill-appearing.  HENT:     Head: Normocephalic and atraumatic.  Eyes:     General: No scleral icterus.    Conjunctiva/sclera: Conjunctivae normal.  Pulmonary:     Effort: Pulmonary effort is normal. No respiratory distress.  Abdominal:     General: Abdomen is flat. There is no distension.     Palpations: Abdomen is  soft.     Tenderness: There is no abdominal tenderness. There is no guarding or rebound.  Genitourinary:    Rectum: External hemorrhoid (Mild) present.     Comments: There is small external hemorrhoid, small amount of blood present after recent BM, no active bleeding Skin:    General: Skin is warm and dry.     Coloration: Skin is not pale.     Findings: No erythema.  Neurological:     General: No focal deficit present.     Mental Status: He is alert and oriented to person, place, and time.  Psychiatric:        Mood and Affect: Mood normal.        Behavior: Behavior normal.      Labs:     Latest Ref Rng & Units 09/01/2022    2:48 AM 08/31/2022    6:24  PM 02/03/2021    4:49 PM  CBC  WBC 4.0 - 10.5 K/uL 4.4  4.1  4.9   Hemoglobin 13.0 - 17.0 g/dL 6.9  4.8  9.9   Hematocrit 39.0 - 52.0 % 22.3  16.9  34.8   Platelets 150 - 400 K/uL 212  240  258       Latest Ref Rng & Units 09/01/2022    2:48 AM 08/31/2022    6:24 PM 02/03/2021    4:49 PM  CMP  Glucose 70 - 99 mg/dL 96  91  88   BUN 6 - 20 mg/dL '8  9  11   '$ Creatinine 0.61 - 1.24 mg/dL 1.12  1.05  1.10   Sodium 135 - 145 mmol/L 137  138  140   Potassium 3.5 - 5.1 mmol/L 3.7  3.7  3.7   Chloride 98 - 111 mmol/L 109  109  106   CO2 22 - 32 mmol/L '24  26  25   '$ Calcium 8.9 - 10.3 mg/dL 8.2  8.2  9.4   Total Protein 6.5 - 8.1 g/dL 5.9  6.1    Total Bilirubin 0.3 - 1.2 mg/dL 1.0  0.7    Alkaline Phos 38 - 126 U/L 42  43    AST 15 - 41 U/L 18  19    ALT 0 - 44 U/L 12  12       Imaging studies:   CTA Abdomen/Pelvis (08/31/2022) personally reviewed which is without significant evidence of GI bleeding, and radiologist report reviewed below:  IMPRESSION: 1. No accumulation of contrast within the GI tract to localize source of GI bleed. 2. Minimal sigmoid colonic diverticulosis without diverticulitis.   Assessment/Plan: (ICD-10's: K58.9) 36 y.o. male with acute on chronic anemia without clear etiology, gradual blood loss from  hemorrhoid is feasible but still may warrant further work up for occult source, complicated by pertinent comorbidities including history of likely iron deficiency anemia.   - He has responded appropriately to transfusion; monitor H&H - No emergent surgical intervention warranted. Question role for outpatient evaluation for elective hemorrhoidectomy if all other sources ruled out/worked up (ie: colonoscopy, endoscopy, capsule study, etc...); GI consultation I believe is still pending   - Monitor abdominal examination - Pain control prn; antiemetics prn   - Further evaluation per primary service  All of the above findings and recommendations were discussed with the patient, and all of patient's questions were answered to his expressed satisfaction.  Thank you for the opportunity to participate in this patient's care.   -- Edison Simon, PA-C [facility] 09/01/2022, 7:24 AM M-F: 7am - 4pm

## 2022-09-01 NOTE — Plan of Care (Signed)
  Problem: Education: Goal: Ability to identify signs and symptoms of gastrointestinal bleeding will improve Outcome: Progressing   Problem: Bowel/Gastric: Goal: Will show no signs and symptoms of gastrointestinal bleeding Outcome: Progressing   Problem: Clinical Measurements: Goal: Complications related to the disease process, condition or treatment will be avoided or minimized Outcome: Progressing   

## 2022-09-02 ENCOUNTER — Encounter
Admission: EM | Disposition: A | Discharge status: Home / Self Care | Payer: Self-pay | Source: Home / Self Care | Attending: Emergency Medicine

## 2022-09-02 ENCOUNTER — Encounter: Payer: Self-pay | Admitting: Internal Medicine

## 2022-09-02 ENCOUNTER — Observation Stay: Payer: Managed Care, Other (non HMO) | Admitting: Anesthesiology

## 2022-09-02 DIAGNOSIS — K625 Hemorrhage of anus and rectum: Secondary | ICD-10-CM | POA: Diagnosis not present

## 2022-09-02 DIAGNOSIS — K922 Gastrointestinal hemorrhage, unspecified: Secondary | ICD-10-CM | POA: Diagnosis present

## 2022-09-02 DIAGNOSIS — K643 Fourth degree hemorrhoids: Secondary | ICD-10-CM

## 2022-09-02 HISTORY — PX: ESOPHAGOGASTRODUODENOSCOPY: SHX5428

## 2022-09-02 HISTORY — PX: COLONOSCOPY: SHX5424

## 2022-09-02 LAB — TYPE AND SCREEN
ABO/RH(D): O POS
Antibody Screen: NEGATIVE
Unit division: 0
Unit division: 0
Unit division: 0

## 2022-09-02 LAB — BPAM RBC
Blood Product Expiration Date: 202309082359
Blood Product Expiration Date: 202310012359
Blood Product Expiration Date: 202310062359
ISSUE DATE / TIME: 202309042029
ISSUE DATE / TIME: 202309042333
ISSUE DATE / TIME: 202309051201
Unit Type and Rh: 5100
Unit Type and Rh: 5100
Unit Type and Rh: 5100

## 2022-09-02 LAB — CBC
HCT: 26.1 % — ABNORMAL LOW (ref 39.0–52.0)
Hemoglobin: 8.3 g/dL — ABNORMAL LOW (ref 13.0–17.0)
MCH: 23.3 pg — ABNORMAL LOW (ref 26.0–34.0)
MCHC: 31.8 g/dL (ref 30.0–36.0)
MCV: 73.3 fL — ABNORMAL LOW (ref 80.0–100.0)
Platelets: 250 10*3/uL (ref 150–400)
RBC: 3.56 MIL/uL — ABNORMAL LOW (ref 4.22–5.81)
RDW: 18.4 % — ABNORMAL HIGH (ref 11.5–15.5)
WBC: 5.6 10*3/uL (ref 4.0–10.5)
nRBC: 0 % (ref 0.0–0.2)

## 2022-09-02 LAB — FOLATE: Folate: 3.6 ng/mL — ABNORMAL LOW (ref >5.9)

## 2022-09-02 LAB — VITAMIN B12: Vitamin B-12: 216 pg/mL (ref 180–914)

## 2022-09-02 SURGERY — COLONOSCOPY
Anesthesia: General

## 2022-09-02 SURGERY — EGD (ESOPHAGOGASTRODUODENOSCOPY)
Anesthesia: General

## 2022-09-02 MED ORDER — PROPOFOL 10 MG/ML IV BOLUS
INTRAVENOUS | Status: DC | PRN
  Administered 2022-09-02: 70 mg via INTRAVENOUS
  Administered 2022-09-02: 80 mg via INTRAVENOUS
  Administered 2022-09-02: 30 mg via INTRAVENOUS

## 2022-09-02 MED ORDER — PROPOFOL 500 MG/50ML IV EMUL
INTRAVENOUS | Status: DC | PRN
  Administered 2022-09-02: 150 ug/kg/min via INTRAVENOUS

## 2022-09-02 MED ORDER — DEXMEDETOMIDINE (PRECEDEX) IN NS 20 MCG/5ML (4 MCG/ML) IV SYRINGE
PREFILLED_SYRINGE | INTRAVENOUS | Status: DC | PRN
  Administered 2022-09-02: 2 ug via INTRAVENOUS
  Administered 2022-09-02: 8 ug via INTRAVENOUS

## 2022-09-02 MED ORDER — SODIUM CHLORIDE 0.9 % IV SOLN: INTRAVENOUS | Status: DC

## 2022-09-02 MED ORDER — LIDOCAINE HCL (CARDIAC) PF 100 MG/5ML IV SOSY
PREFILLED_SYRINGE | INTRAVENOUS | Status: DC | PRN
  Administered 2022-09-02: 50 mg via INTRAVENOUS

## 2022-09-02 NOTE — Anesthesia Preprocedure Evaluation (Signed)
Anesthesia Evaluation  Patient identified by MRN, date of birth, ID band Patient awake    Reviewed: Allergy & Precautions, NPO status , Patient's Chart, lab work & pertinent test results  History of Anesthesia Complications Negative for: history of anesthetic complications  Airway Mallampati: III  TM Distance: >3 FB Neck ROM: full    Dental  (+) Chipped   Pulmonary neg pulmonary ROS, neg shortness of breath, Current Smoker and Patient abstained from smoking.,    Pulmonary exam normal        Cardiovascular Exercise Tolerance: Good (-) anginanegative cardio ROS Normal cardiovascular exam     Neuro/Psych negative neurological ROS  negative psych ROS   GI/Hepatic negative GI ROS, Neg liver ROS,   Endo/Other  negative endocrine ROS  Renal/GU negative Renal ROS  negative genitourinary   Musculoskeletal   Abdominal   Peds  Hematology  (+) Blood dyscrasia, anemia ,   Anesthesia Other Findings Past Medical History: No date: Anemia No date: Bronchitis No date: Tobacco abuse  Past Surgical History: 03/04/2018: COLONOSCOPY; N/A     Comment:  Procedure: COLONOSCOPY;  Surgeon: Lin Landsman,               MD;  Location: [facility] ENDOSCOPY;  Service:               Gastroenterology;  Laterality: N/A; 05/19/2020: COLONOSCOPY; N/A     Comment:  Procedure: COLONOSCOPY;  Surgeon: Toledo, Benay Pike, MD;              Location: [facility] ENDOSCOPY;  Service: Gastroenterology;                Laterality: N/A; 03/04/2018: ESOPHAGOGASTRODUODENOSCOPY; N/A     Comment:  Procedure: ESOPHAGOGASTRODUODENOSCOPY (EGD);  Surgeon:               Lin Landsman, MD;  Location: [facility] ENDOSCOPY;                Service: Gastroenterology;  Laterality: N/A; 05/19/2020: ESOPHAGOGASTRODUODENOSCOPY; N/A     Comment:  Procedure: ESOPHAGOGASTRODUODENOSCOPY (EGD);  Surgeon:               Toledo, Benay Pike, MD;  Location: [facility] ENDOSCOPY;                 Service: Gastroenterology;  Laterality: N/A; No date: NO PAST SURGERIES  BMI    Body Mass Index: 23.19 kg/m      Reproductive/Obstetrics negative OB ROS                             Anesthesia Physical Anesthesia Plan  ASA: 2  Anesthesia Plan: General   Post-op Pain Management:    Induction: Intravenous  PONV Risk Score and Plan: Propofol infusion and TIVA  Airway Management Planned: Natural Airway and Nasal Cannula  Additional Equipment:   Intra-op Plan:   Post-operative Plan:   Informed Consent: I have reviewed the patients History and Physical, chart, labs and discussed the procedure including the risks, benefits and alternatives for the proposed anesthesia with the patient or authorized representative who has indicated his/her understanding and acceptance.     Dental Advisory Given  Plan Discussed with: Anesthesiologist, CRNA and Surgeon  Anesthesia Plan Comments: (Patient consented for risks of anesthesia including but not limited to:  - adverse reactions to medications - risk of airway placement if required - damage to eyes, teeth, lips or other oral mucosa - nerve damage due  to positioning  - sore throat or hoarseness - Damage to heart, brain, nerves, lungs, other parts of body or loss of life  Patient voiced understanding.)        Anesthesia Quick Evaluation

## 2022-09-02 NOTE — Op Note (Signed)
[facility] Patient Name: Douglas Patton Procedure Date: 09/02/2022 4:32 PM MRN: [id number] Account #: [id number] Date of Birth: 1986/08/24 Admit Type: Inpatient Age: 36 Room: [facility] ENDO ROOM 2 Gender: Male Note Status: Finalized Instrument Name: Jasper Riling [id number] Procedure:             Colonoscopy Indications:           Hematochezia, Iron deficiency anemia secondary to                         chronic blood loss Providers:             Benay Pike. Alice Reichert MD, MD Referring MD:          No Local Md, MD (Referring MD) Medicines:             Propofol per Anesthesia Complications:         No immediate complications. Procedure:             Pre-Anesthesia Assessment:                        - The risks and benefits of the procedure and the                         sedation options and risks were discussed with the                         patient. All questions were answered and informed                         consent was obtained.                        - Patient identification and proposed procedure were                         verified prior to the procedure by the nurse. The                         procedure was verified in the procedure room.                        - ASA Grade Assessment: II - A patient with mild                         systemic disease.                        - After reviewing the risks and benefits, the patient                         was deemed in satisfactory condition to undergo the                         procedure.                        After obtaining informed consent, the colonoscope was                         passed under direct  vision. Throughout the procedure,                         the patient's blood pressure, pulse, and oxygen                         saturations were monitored continuously. The                         Colonoscope was introduced through the anus and                         advanced to the the  terminal ileum, with                         identification of the appendiceal orifice and IC                         valve. The colonoscopy was performed without                         difficulty. The patient tolerated the procedure well.                         The quality of the bowel preparation was good. The                         terminal ileum, ileocecal valve, appendiceal orifice,                         and rectum were photographed. Findings:      The perianal exam findings include internal hemorrhoids that prolapse       with straining, but require manual replacement into the anal canal       (Grade III).      Non-bleeding internal hemorrhoids were found during retroflexion. The       hemorrhoids were Grade III (internal hemorrhoids that prolapse but       require manual reduction).      The colon (entire examined portion) appeared normal.      The terminal ileum appeared normal. Impression:            - Internal hemorrhoids that prolapse with straining,                         but require manual replacement into the anal canal                         (Grade III) found on perianal exam.                        - Non-bleeding internal hemorrhoids.                        - The entire examined colon is normal.                        - The examined portion of the ileum was normal.                        -  No specimens collected. Recommendation:        - Patient has a contact number available for                         emergencies. The signs and symptoms of potential                         delayed complications were discussed with the patient.                         Return to normal activities tomorrow. Written                         discharge instructions were provided to the patient.                        - Resume previous diet.                        - Continue present medications.                        - Consider hemorrhoidectomy                        - The findings  and recommendations were discussed with                         the patient.                        - Further recommendations per surgical service                         attending. Procedure Code(s):     --- Professional ---                        [phone], Colonoscopy, flexible; diagnostic, including                         collection of specimen(s) by brushing or washing, when                         performed (separate procedure) Diagnosis Code(s):     --- Professional ---                        D50.0, Iron deficiency anemia secondary to blood loss                         (chronic)                        K92.1, Melena (includes Hematochezia)                        K64.2, Third degree hemorrhoids CPT copyright 2019 American Medical Association. All rights reserved. The codes documented in this report are preliminary and upon coder review may  be revised to meet current compliance requirements. Efrain Sella MD, MD 09/02/2022 5:05:54 PM This report has been signed electronically. Number of Addenda: 0 Note Initiated On:  09/02/2022 4:32 PM Scope Withdrawal Time: 0 hours 5 minutes 14 seconds  Total Procedure Duration: 0 hours 8 minutes 41 seconds  Estimated Blood Loss:  Estimated blood loss: none.      [facility]

## 2022-09-02 NOTE — Anesthesia Postprocedure Evaluation (Signed)
Anesthesia Post Note  Patient: Douglas Patton  Procedure(s) Performed: COLONOSCOPY ESOPHAGOGASTRODUODENOSCOPY (EGD)  Anesthesia Type: General Anesthetic complications: no   No notable events documented.   Last Vitals:  Vitals:   09/02/22 1714 09/02/22 1724  BP: 115/72 127/70  Pulse: 75 72  Resp: 18 17  Temp:    SpO2: 100% 100%    Last Pain:  Vitals:   09/02/22 1724  TempSrc:   PainSc: 0-No pain                 Precious Haws Cardell Rachel

## 2022-09-02 NOTE — TOC CM/SW Note (Signed)
  Transition of Care [facility]) Screening Note   Patient Details  Name: Douglas Patton Date of Birth: 1986-10-13   Transition of Care [facility]) CM/SW Contact:    Candie Chroman, LCSW Phone Number: 09/02/2022, 9:51 AM    Transition of Care Department [facility]) has reviewed patient and no TOC needs have been identified at this time. We will continue to monitor patient advancement through interdisciplinary progression rounds. If new patient transition needs arise, please place a TOC consult.

## 2022-09-02 NOTE — Op Note (Signed)
[facility] Patient Name: Douglas Patton Procedure Date: 09/02/2022 4:33 PM MRN: [id number] Account #: [id number] Date of Birth: 1986-04-23 Admit Type: Inpatient Age: 36 Room: [facility] ENDO ROOM 2 Gender: Male Note Status: Finalized Instrument Name: Upper Endoscope [id number] Procedure:             Upper GI endoscopy Indications:           Iron deficiency anemia secondary to chronic blood loss Providers:             Benay Pike. Alice Reichert MD, MD Referring MD:          No Local Md, MD (Referring MD) Medicines:             Propofol per Anesthesia Complications:         No immediate complications. Procedure:             Pre-Anesthesia Assessment:                        - The risks and benefits of the procedure and the                         sedation options and risks were discussed with the                         patient. All questions were answered and informed                         consent was obtained.                        - Patient identification and proposed procedure were                         verified prior to the procedure by the nurse. The                         procedure was verified in the procedure room.                        - ASA Grade Assessment: II - A patient with mild                         systemic disease.                        - After reviewing the risks and benefits, the patient                         was deemed in satisfactory condition to undergo the                         procedure.                        After obtaining informed consent, the endoscope was                         passed under direct vision. Throughout the procedure,  the patient's blood pressure, pulse, and oxygen                         saturations were monitored continuously. The Endoscope                         was introduced through the mouth, and advanced to the                         third part of duodenum. The upper GI  endoscopy was                         accomplished without difficulty. The patient tolerated                         the procedure well. Findings:      The esophagus was normal.      Patchy mild inflammation characterized by congestion (edema) and       erythema was found in the gastric antrum. Biopsies were taken with a       cold forceps for Helicobacter pylori testing.      The cardia and gastric fundus were normal on retroflexion.      Patchy mild inflammation characterized by erythema and friability was       found in the duodenal bulb.      The second portion of the duodenum and third portion of the duodenum       were normal.      The exam was otherwise without abnormality. Impression:            - Normal esophagus.                        - Gastritis. Biopsied.                        - Duodenitis.                        - Normal second portion of the duodenum and third                         portion of the duodenum.                        - The examination was otherwise normal. Recommendation:        - Await pathology results.                        - Proceed with colonoscopy Procedure Code(s):     --- Professional ---                        [phone], Esophagogastroduodenoscopy, flexible,                         transoral; with biopsy, single or multiple Diagnosis Code(s):     --- Professional ---                        D50.0, Iron deficiency anemia secondary to blood loss                         (  chronic)                        K29.80, Duodenitis without bleeding                        K29.70, Gastritis, unspecified, without bleeding CPT copyright 2019 American Medical Association. All rights reserved. The codes documented in this report are preliminary and upon coder review may  be revised to meet current compliance requirements. Efrain Sella MD, MD 09/02/2022 4:50:16 PM This report has been signed electronically. Number of Addenda: 0 Note Initiated On: 09/02/2022 4:33  PM Estimated Blood Loss:  Estimated blood loss: none.      [facility]

## 2022-09-02 NOTE — Anesthesia Postprocedure Evaluation (Signed)
Anesthesia Post Note  Patient: Douglas Patton  Procedure(s) Performed: COLONOSCOPY ESOPHAGOGASTRODUODENOSCOPY (EGD)  Patient location during evaluation: Endoscopy Anesthesia Type: General Level of consciousness: awake and alert Pain management: pain level controlled Vital Signs Assessment: post-procedure vital signs reviewed and stable Respiratory status: spontaneous breathing, nonlabored ventilation, respiratory function stable and patient connected to nasal cannula oxygen Cardiovascular status: blood pressure returned to baseline and stable Postop Assessment: no apparent nausea or vomiting Anesthetic complications: no   No notable events documented.   Last Vitals:  Vitals:   09/02/22 1714 09/02/22 1724  BP: 115/72 127/70  Pulse: 75 72  Resp: 18 17  Temp:    SpO2: 100% 100%    Last Pain:  Vitals:   09/02/22 1724  TempSrc:   PainSc: 0-No pain                 Precious Haws Akiyah Eppolito

## 2022-09-02 NOTE — Interval H&P Note (Signed)
History and Physical Interval Note:  09/02/2022 4:29 PM  Douglas Patton  has presented today for surgery, with the diagnosis of lower GI bleeding, acute blood loss anemia.  The various methods of treatment have been discussed with the patient and family. After consideration of risks, benefits and other options for treatment, the patient has consented to  Procedure(s): COLONOSCOPY (N/A) ESOPHAGOGASTRODUODENOSCOPY (EGD) (N/A) as a surgical intervention.  The patient's history has been reviewed, patient examined, no change in status, stable for surgery.  I have reviewed the patient's chart and labs.  Questions were answered to the patient's satisfaction.     [place], [place]

## 2022-09-02 NOTE — Progress Notes (Signed)
CC: hemorrhoids Subjective: Doing well.  No hematochezia no rectal pain.  Completed prep.  GI to do upper and lower scopes today  Objective: Vital signs in last 24 hours: Temp:  [97.7 F (36.5 C)-98.6 F (37 C)] 98.2 F (36.8 C) (09/06 0817) Pulse Rate:  [56-81] 56 (09/06 0817) Resp:  [16-20] 16 (09/06 0817) BP: (114-137)/(64-76) 131/75 (09/06 0817) SpO2:  [98 %-100 %] 98 % (09/06 0817) Last BM Date :  (PTA)  Intake/Output from previous day: 09/05 0701 - 09/06 0700 In: 657.7 [I.V.:249.3; Blood:408.3] Out: -  Intake/Output this shift: Total I/O In: 233.6 [I.V.:233.6] Out: -   Physical exam: Constitutional:      General: He is not in acute distress.    Appearance: Normal appearance. He is normal weight. He is not ill-appearing.  HENT:     Head: Normocephalic and atraumatic.  Eyes:     General: No scleral icterus.    Conjunctiva/sclera: Conjunctivae normal.  Pulmonary:     Effort: Pulmonary effort is normal. No respiratory distress.  Abdominal:     General: Abdomen is flat. There is no distension.     Palpations: Abdomen is soft.     Tenderness: There is no abdominal tenderness. There is no guarding or rebound.  Genitourinary:    Rectum: There is evidence of prolapse internal hemorrhoids grade 4 left posterolateral and left anterolateral as well as a right lateral.  No active bleeding.  No evidence of necrosis no active bleeding Skin:    General: Skin is warm and dry.     Coloration: Skin is not pale.     Findings: No erythema.  Neurological:     General: No focal deficit present.     Mental Status: He is alert and oriented to person, place, and time.  Psychiatric:        Mood and Affect: Mood normal.        Behavior: Behavior normal.     Lab Results: CBC  Recent Labs    09/01/22 0248 09/01/22 1827 09/02/22 0505  WBC 4.4  --  5.6  HGB 6.9* 8.9* 8.3*  HCT 22.3* 28.2* 26.1*  PLT 212  --  250   BMET Recent Labs    08/31/22 1824 09/01/22 0248  NA 138  137  K 3.7 3.7  CL 109 109  CO2 26 24  GLUCOSE 91 96  BUN 9 8  CREATININE 1.05 1.12  CALCIUM 8.2* 8.2*   PT/INR No results for input(s): "LABPROT", "INR" in the last 72 hours. ABG No results for input(s): "PHART", "HCO3" in the last 72 hours.  Invalid input(s): "PCO2", "PO2"  Studies/Results: CT Angio Abd/Pel W and/or Wo Contrast  Result Date: 08/31/2022 CLINICAL DATA:  Lower GI bleed. EXAM: CTA ABDOMEN AND PELVIS WITHOUT AND WITH CONTRAST TECHNIQUE: Multidetector CT imaging of the abdomen and pelvis was performed using the standard protocol during bolus administration of intravenous contrast. Multiplanar reconstructed images and MIPs were obtained and reviewed to evaluate the vascular anatomy. RADIATION DOSE REDUCTION: This exam was performed according to the departmental dose-optimization program which includes automated exposure control, adjustment of the mA and/or kV according to patient size and/or use of iterative reconstruction technique. CONTRAST:  144m OMNIPAQUE IOHEXOL 350 MG/ML SOLN COMPARISON:  Abdominal CTA 10/15/2019 FINDINGS: VASCULAR Aorta: Normal caliber aorta without aneurysm, dissection, vasculitis or significant stenosis. Celiac: Patent without evidence of aneurysm, dissection, vasculitis or significant stenosis. SMA: Patent without evidence of aneurysm, dissection, vasculitis or significant stenosis. Renals: 2 left and single right  renal arteries, all renal arteries are patent. No acute findings or stenosis. IMA: Patent without evidence of aneurysm, dissection, vasculitis or significant stenosis. Inflow: Patent without evidence of aneurysm, dissection, vasculitis or significant stenosis. Proximal Outflow: Bilateral common femoral and visualized portions of the superficial and profunda femoral arteries are patent without evidence of aneurysm, dissection, vasculitis or significant stenosis. Veins: Venous phase imaging demonstrates patency of the portal, splenic, and superior  mesenteric veins. Unremarkable appearance of the iliac veins and IVC. No acute venous findings. Review of the MIP images confirms the above findings. NON-VASCULAR Lower chest: Clear lung bases Hepatobiliary: Stable low-density liver lesions, likely representing cysts. There also enhancing foci in the left lobe of the liver that are stable and likely represent flash filling hemangiomas. These are also unchanged. No specific imaging follow-up is recommended. Unremarkable gallbladder. No biliary dilatation. Pancreas: Unremarkable. No pancreatic ductal dilatation or surrounding inflammatory changes. Spleen: Normal in size without focal abnormality. Adrenals/Urinary Tract: Normal adrenal glands. Unremarkable appearance of the kidneys. Physiologically distended gallbladder. Stomach/Bowel: There is no accumulation of contrast within the GI tract to localize source of GI bleed. No bowel obstruction or inflammation. Moderate colonic stool burden. There may be a few sigmoid colonic diverticula without diverticulitis. Normal appendix Lymphatic: No adenopathy. Reproductive: Prostate is unremarkable. Other: No free air, free fluid, or intra-abdominal fluid collection. Musculoskeletal: There are no acute or suspicious osseous abnormalities. IMPRESSION: 1. No accumulation of contrast within the GI tract to localize source of GI bleed. 2. Minimal sigmoid colonic diverticulosis without diverticulitis. Electronically Signed   By: Keith Rake M.D.   On: 08/31/2022 20:31    Anti-infectives: Anti-infectives (From admission, onward)    None       Assessment/Plan:  36 year old with occult GI bleed presumably.  Hemorrhoids are very unlikely the source of bleeding and I have not seen because severe anemia down to a level of 4.  I Do not recommend any surgical intervention for hemorrhoids yet.   I do think that we need to figure it out if there is persistent bleeding from the upper or lower source to include the  small  bowel and also evaluate bone marrow as well as other potential sites of blood loss or hemolysis. I will be happy to see him in my office.  We will be available.  Please note that I spent 35 minutes in this encounter including personally reviewing the images, counseling the patient and performing appropriate documentation  Caroleen Hamman, MD, FACS  09/02/2022

## 2022-09-02 NOTE — Progress Notes (Signed)
PROGRESS NOTE    Douglas Patton  UXL:[id number] DOB: [birth year] DOA: 08/31/2022 PCP: Patient, No Pcp Per    Brief Narrative:  36 y.o. male with medical history significant for anemia, history of bulbar duodenitis, colonic polyps, cecal polyps, diverticulosis, internal hemorrhoids on endoscopy, bronchitis, tobacco use disorder, who presented to the hospital with intermittent rectal bleeding that has gotten worse over the past few weeks.  He noticed increasing generalized weakness, easy fatigability and shortness of breath with exertion, for about 2 to 3 days preceding admission.   He was found to have severe anemia with hemoglobin of 4.8 on admission. Received 2 units PRBC.  Hemoglobin 8.3 on 9/6.  Patient states he is not symptomatically much improved.  Plan for upper and lower endoscopy today with GI.  General surgery consulted.  No indication for hemorrhoidectomy at this time.   Assessment & Plan:   Principal Problem:   Rectal bleeding Active Problems:   Acute blood loss anemia   TOBACCO DEPENDENCE   Hemorrhoids   Severe anemia   GI bleed  Rectal bleeding Severe acute blood loss anemia Severe iron deficiency Patient presents with rectal bleeding and generalized weakness Received 2 units PRBC on arrival for hemoglobin 4.8 Hemoglobin responded and now at 8.3 as of 9/6 Differentials include NSAID related GI bleeding versus iron deficiency anemia of unclear etiology Received 200 mg IV Venofer x1 Plan: N.p.o. for upper and lower endoscopy today No plans for any surgical intervention Continue IV PPI Check B12 Further recommendations post endoscopy  Tobacco dependence For nicotine patch    DVT prophylaxis: SCD Code Status: Full Family Communication: None today Disposition Plan: Status is: Observation The patient will require care spanning > 2 midnights and should be moved to inpatient because: Severe blood loss anemia.  Severe iron deficiency anemia.  Endoscopic  intervention planned today.   Level of care: Med-Surg  Consultants:  GI  Procedures:  EGD Colonoscopy  Antimicrobials: None   Subjective: Seen and examined.  Resting comfortably in bed.  Symptomatically much improved.  No complaints  Objective: Vitals:   09/01/22 1743 09/01/22 2018 09/02/22 0425 09/02/22 0817  BP: 131/73 119/65 114/64 131/75  Pulse: 81 65 (!) 57 (!) 56  Resp: '18 20 18 16  '$ Temp: 98.6 F (37 C) 98.3 F (36.8 C) 97.7 F (36.5 C) 98.2 F (36.8 C)  TempSrc: Oral  Oral   SpO2: 100% 100% 98% 98%  Weight:      Height:        Intake/Output Summary (Last 24 hours) at 09/02/2022 1507 Last data filed at 09/02/2022 0721 Gross per 24 hour  Intake 641.96 ml  Output --  Net 641.96 ml   Filed Weights   08/31/22 1706 09/01/22 0523  Weight: 90.7 kg 77.7 kg    Examination:  General exam: Appears calm and comfortable  Respiratory system: Clear to auscultation. Respiratory effort normal. Cardiovascular system: S1-S2, RRR, no murmurs, no pedal edema Gastrointestinal system: Soft, NT/ND, normal bowel sounds Central nervous system: Alert and oriented. No focal neurological deficits. Extremities: Symmetric 5 x 5 power. Skin: No rashes, lesions or ulcers Psychiatry: Judgement and insight appear normal. Mood & affect appropriate.     Data Reviewed: I have personally reviewed following labs and imaging studies  CBC: Recent Labs  Lab 08/31/22 1824 09/01/22 0248 09/01/22 1827 09/02/22 0505  WBC 4.1 4.4  --  5.6  NEUTROABS 1.6* 1.6*  --   --   HGB 4.8* 6.9* 8.9* 8.3*  HCT 16.9* 22.3*  28.2* 26.1*  MCV 70.4* 73.4*  --  73.3*  PLT 240 212  --  564   Basic Metabolic Panel: Recent Labs  Lab 08/31/22 1824 09/01/22 0248  NA 138 137  K 3.7 3.7  CL 109 109  CO2 26 24  GLUCOSE 91 96  BUN 9 8  CREATININE 1.05 1.12  CALCIUM 8.2* 8.2*   GFR: Estimated Creatinine Clearance: 101 mL/min (by C-G formula based on SCr of 1.12 mg/dL). Liver Function  Tests: Recent Labs  Lab 08/31/22 1824 09/01/22 0248  AST 19 18  ALT 12 12  ALKPHOS 43 42  BILITOT 0.7 1.0  PROT 6.1* 5.9*  ALBUMIN 3.6 3.4*   No results for input(s): "LIPASE", "AMYLASE" in the last 168 hours. No results for input(s): "AMMONIA" in the last 168 hours. Coagulation Profile: No results for input(s): "INR", "PROTIME" in the last 168 hours. Cardiac Enzymes: No results for input(s): "CKTOTAL", "CKMB", "CKMBINDEX", "TROPONINI" in the last 168 hours. BNP (last 3 results) No results for input(s): "PROBNP" in the last 8760 hours. HbA1C: No results for input(s): "HGBA1C" in the last 72 hours. CBG: No results for input(s): "GLUCAP" in the last 168 hours. Lipid Profile: No results for input(s): "CHOL", "HDL", "LDLCALC", "TRIG", "CHOLHDL", "LDLDIRECT" in the last 72 hours. Thyroid Function Tests: No results for input(s): "TSH", "T4TOTAL", "FREET4", "T3FREE", "THYROIDAB" in the last 72 hours. Anemia Panel: Recent Labs    09/01/22 0248  FERRITIN 2*  TIBC 374  IRON 13*   Sepsis Labs: No results for input(s): "PROCALCITON", "LATICACIDVEN" in the last 168 hours.  Recent Results (from the past 240 hour(s))  Resp Panel by RT-PCR (Flu A&B, Covid) Anterior Nasal Swab     Status: None   Collection Time: 08/31/22  6:24 PM   Specimen: Anterior Nasal Swab  Result Value Ref Range Status   SARS Coronavirus 2 by RT PCR NEGATIVE NEGATIVE Final    Comment: (NOTE) SARS-CoV-2 target nucleic acids are NOT DETECTED.  The SARS-CoV-2 RNA is generally detectable in upper respiratory specimens during the acute phase of infection. The lowest concentration of SARS-CoV-2 viral copies this assay can detect is 138 copies/mL. A negative result does not preclude SARS-Cov-2 infection and should not be used as the sole basis for treatment or other patient management decisions. A negative result may occur with  improper specimen collection/handling, submission of specimen other than  nasopharyngeal swab, presence of viral mutation(s) within the areas targeted by this assay, and inadequate number of viral copies(<138 copies/mL). A negative result must be combined with clinical observations, patient history, and epidemiological information. The expected result is Negative.  Fact Sheet for Patients:  EntrepreneurPulse.com.au  Fact Sheet for Healthcare Providers:  IncredibleEmployment.be  This test is no t yet approved or cleared by the [place] FDA and  has been authorized for detection and/or diagnosis of SARS-CoV-2 by FDA under an Emergency Use Authorization (EUA). This EUA will remain  in effect (meaning this test can be used) for the duration of the COVID-19 declaration under Section 564(b)(1) of the Act, 21 U.S.C.section 360bbb-3(b)(1), unless the authorization is terminated  or revoked sooner.       Influenza A by PCR NEGATIVE NEGATIVE Final   Influenza B by PCR NEGATIVE NEGATIVE Final    Comment: (NOTE) The Xpert Xpress SARS-CoV-2/FLU/RSV plus assay is intended as an aid in the diagnosis of influenza from Nasopharyngeal swab specimens and should not be used as a sole basis for treatment. Nasal washings and aspirates are unacceptable for  Xpert Xpress SARS-CoV-2/FLU/RSV testing.  Fact Sheet for Patients: EntrepreneurPulse.com.au  Fact Sheet for Healthcare Providers: IncredibleEmployment.be  This test is not yet approved or cleared by the [place] FDA and has been authorized for detection and/or diagnosis of SARS-CoV-2 by FDA under an Emergency Use Authorization (EUA). This EUA will remain in effect (meaning this test can be used) for the duration of the COVID-19 declaration under Section 564(b)(1) of the Act, 21 U.S.C. section 360bbb-3(b)(1), unless the authorization is terminated or revoked.  Performed at [facility], [address], [place], [place]  [address]          Radiology Studies: CT Angio Abd/Pel W and/or Wo Contrast  Result Date: 08/31/2022 CLINICAL DATA:  Lower GI bleed. EXAM: CTA ABDOMEN AND PELVIS WITHOUT AND WITH CONTRAST TECHNIQUE: Multidetector CT imaging of the abdomen and pelvis was performed using the standard protocol during bolus administration of intravenous contrast. Multiplanar reconstructed images and MIPs were obtained and reviewed to evaluate the vascular anatomy. RADIATION DOSE REDUCTION: This exam was performed according to the departmental dose-optimization program which includes automated exposure control, adjustment of the mA and/or kV according to patient size and/or use of iterative reconstruction technique. CONTRAST:  18m OMNIPAQUE IOHEXOL 350 MG/ML SOLN COMPARISON:  Abdominal CTA 10/15/2019 FINDINGS: VASCULAR Aorta: Normal caliber aorta without aneurysm, dissection, vasculitis or significant stenosis. Celiac: Patent without evidence of aneurysm, dissection, vasculitis or significant stenosis. SMA: Patent without evidence of aneurysm, dissection, vasculitis or significant stenosis. Renals: 2 left and single right renal arteries, all renal arteries are patent. No acute findings or stenosis. IMA: Patent without evidence of aneurysm, dissection, vasculitis or significant stenosis. Inflow: Patent without evidence of aneurysm, dissection, vasculitis or significant stenosis. Proximal Outflow: Bilateral common femoral and visualized portions of the superficial and profunda femoral arteries are patent without evidence of aneurysm, dissection, vasculitis or significant stenosis. Veins: Venous phase imaging demonstrates patency of the portal, splenic, and superior mesenteric veins. Unremarkable appearance of the iliac veins and IVC. No acute venous findings. Review of the MIP images confirms the above findings. NON-VASCULAR Lower chest: Clear lung bases Hepatobiliary: Stable low-density liver lesions, likely representing cysts.  There also enhancing foci in the left lobe of the liver that are stable and likely represent flash filling hemangiomas. These are also unchanged. No specific imaging follow-up is recommended. Unremarkable gallbladder. No biliary dilatation. Pancreas: Unremarkable. No pancreatic ductal dilatation or surrounding inflammatory changes. Spleen: Normal in size without focal abnormality. Adrenals/Urinary Tract: Normal adrenal glands. Unremarkable appearance of the kidneys. Physiologically distended gallbladder. Stomach/Bowel: There is no accumulation of contrast within the GI tract to localize source of GI bleed. No bowel obstruction or inflammation. Moderate colonic stool burden. There may be a few sigmoid colonic diverticula without diverticulitis. Normal appendix Lymphatic: No adenopathy. Reproductive: Prostate is unremarkable. Other: No free air, free fluid, or intra-abdominal fluid collection. Musculoskeletal: There are no acute or suspicious osseous abnormalities. IMPRESSION: 1. No accumulation of contrast within the GI tract to localize source of GI bleed. 2. Minimal sigmoid colonic diverticulosis without diverticulitis. Electronically Signed   By: MKeith RakeM.D.   On: 08/31/2022 20:31        Scheduled Meds:  nicotine  21 mg Transdermal Daily   [START ON 09/04/2022] pantoprazole  40 mg Intravenous Q12H   Continuous Infusions:  pantoprazole 8 mg/hr (09/02/22 1359)     LOS: 2 days     SSidney Ace MD [facility]   If 7PM-7AM, please contact night-coverage  09/02/2022, 3:07 PM

## 2022-09-02 NOTE — Transfer of Care (Signed)
Immediate Anesthesia Transfer of Care Note  Patient: Douglas Patton  Procedure(s) Performed: COLONOSCOPY ESOPHAGOGASTRODUODENOSCOPY (EGD)  Patient Location: PACU  Anesthesia Type:General  Level of Consciousness: sedated  Airway & Oxygen Therapy: Patient Spontanous Breathing  Post-op Assessment: Report given to RN and Post -op Vital signs reviewed and stable  Post vital signs: Reviewed and stable  Last Vitals:  Vitals Value Taken Time  BP 103/61 09/02/22 1704  Temp 36.8 C 09/02/22 1704  Pulse 82 09/02/22 1704  Resp 16 09/02/22 1704  SpO2 98 % 09/02/22 1704    Last Pain:  Vitals:   09/02/22 1704  TempSrc: Temporal  PainSc: Asleep         Complications: No notable events documented.

## 2022-09-03 ENCOUNTER — Encounter: Payer: Self-pay | Admitting: Internal Medicine

## 2022-09-03 DIAGNOSIS — K625 Hemorrhage of anus and rectum: Secondary | ICD-10-CM | POA: Diagnosis not present

## 2022-09-03 DIAGNOSIS — D500 Iron deficiency anemia secondary to blood loss (chronic): Secondary | ICD-10-CM | POA: Insufficient documentation

## 2022-09-03 DIAGNOSIS — D509 Iron deficiency anemia, unspecified: Secondary | ICD-10-CM | POA: Insufficient documentation

## 2022-09-03 LAB — CBC WITH DIFFERENTIAL/PLATELET
Abs Immature Granulocytes: 0.01 10*3/uL (ref 0.00–0.07)
Basophils Absolute: 0 10*3/uL (ref 0.0–0.1)
Basophils Relative: 1 %
Eosinophils Absolute: 0.3 10*3/uL (ref 0.0–0.5)
Eosinophils Relative: 5 %
HCT: 28 % — ABNORMAL LOW (ref 39.0–52.0)
Hemoglobin: 8.9 g/dL — ABNORMAL LOW (ref 13.0–17.0)
Immature Granulocytes: 0 %
Lymphocytes Relative: 30 %
Lymphs Abs: 1.8 10*3/uL (ref 0.7–4.0)
MCH: 23.6 pg — ABNORMAL LOW (ref 26.0–34.0)
MCHC: 31.8 g/dL (ref 30.0–36.0)
MCV: 74.3 fL — ABNORMAL LOW (ref 80.0–100.0)
Monocytes Absolute: 0.5 10*3/uL (ref 0.1–1.0)
Monocytes Relative: 8 %
Neutro Abs: 3.5 10*3/uL (ref 1.7–7.7)
Neutrophils Relative %: 56 %
Platelets: 260 10*3/uL (ref 150–400)
RBC: 3.77 MIL/uL — ABNORMAL LOW (ref 4.22–5.81)
RDW: 19.2 % — ABNORMAL HIGH (ref 11.5–15.5)
WBC: 6.1 10*3/uL (ref 4.0–10.5)
nRBC: 0 % (ref 0.0–0.2)

## 2022-09-03 MED ORDER — POLYSACCHARIDE IRON COMPLEX 150 MG PO CAPS: 150.0000 mg | ORAL_CAPSULE | Freq: Two times a day (BID) | ORAL | 0 refills | Status: AC

## 2022-09-03 MED ORDER — SODIUM CHLORIDE 0.9 % IV SOLN
300.0000 mg | Freq: Once | INTRAVENOUS | Status: AC
  Administered 2022-09-03: 300 mg via INTRAVENOUS
  Filled 2022-09-03: qty 300
  Filled 2022-09-03: qty 15

## 2022-09-03 MED ORDER — PANTOPRAZOLE SODIUM 40 MG PO TBEC: 40.0000 mg | DELAYED_RELEASE_TABLET | Freq: Every day | ORAL | 0 refills | Status: DC

## 2022-09-03 NOTE — Plan of Care (Signed)

## 2022-09-03 NOTE — Progress Notes (Signed)
PIV removed. AVS reviewed. Patient verbalizes understanding of necessary medication regimen and follow up appointments.

## 2022-09-03 NOTE — Discharge Summary (Signed)
Physician Discharge Summary  Douglas Patton VHQ:[id number] DOB: 1986/09/11 DOA: 08/31/2022  PCP: Patient, No Pcp Per  Admit date: 08/31/2022 Discharge date: 09/03/2022  Admitted From: Home Disposition:  Home  Recommendations for Outpatient Follow-up:  Follow up with PCP in 1-2 weeks Follow-up with GI and surgery as directed Outpatient referral to hematology  Home Health: No Equipment/Devices: None  Discharge Condition: Stable CODE STATUS: Full Diet recommendation: Regular  Brief/Interim Summary:  36 y.o. male with medical history significant for anemia, history of bulbar duodenitis, colonic polyps, cecal polyps, diverticulosis, internal hemorrhoids on endoscopy, bronchitis, tobacco use disorder, who presented to the hospital with intermittent rectal bleeding that has gotten worse over the past few weeks.  He noticed increasing generalized weakness, easy fatigability and shortness of breath with exertion, for about 2 to 3 days preceding admission.   He was found to have severe anemia with hemoglobin of 4.8 on admission. Received 2 units PRBC.  Hemoglobin 8.3 on 9/6.  Patient states he is not symptomatically much improved.  Plan for upper and lower endoscopy today with GI.  General surgery consulted.  No indication for hemorrhoidectomy at this time.  Patient underwent upper and lower endoscopy on 9/6.  No bleeding source identified.  Per GI likely chronic blood loss.  Monitored in hospital overnight.  Hemoglobin following morning was stable.  We will give another dose of IV iron considering severity of iron deficiency.  Stable for discharge at this time.  We will follow-up outpatient PCP, GI, general surgery.  Outpatient referral to hematology to consider bone marrow evaluation for severe iron deficiency.  Discharge Diagnoses:  Principal Problem:   Rectal bleeding Active Problems:   Acute blood loss anemia   TOBACCO DEPENDENCE   Hemorrhoids   Severe anemia   GI bleed   Iron  deficiency anemia due to chronic blood loss  Rectal bleeding Severe acute blood loss anemia Severe iron deficiency Patient presents with rectal bleeding and generalized weakness Received 2 units PRBC on arrival for hemoglobin 4.8 Hemoglobin responded and now at 8.3 as of 9/6 Differentials include NSAID related GI bleeding versus iron deficiency anemia of unclear etiology Received 200 mg IV Venofer x1 Upper and lower endoscopy negative for bleed Plan: Discharge home.  Daily PPI recommended.  Resume iron replacement p.o.  Follow-up outpatient PCP, general surgery, GI.  Outpatient referral to hematology.   Tobacco dependence Counseled patient  Discharge Instructions  Discharge Instructions     Ambulatory referral to Hematology / Oncology   Complete by: As directed    Diet - low sodium heart healthy   Complete by: As directed    Increase activity slowly   Complete by: As directed       Allergies as of 09/03/2022   No Known Allergies      Medication List     STOP taking these medications    cyanocobalamin 1000 MCG tablet Commonly known as: VITAMIN B12   cyclobenzaprine 10 MG tablet Commonly known as: FLEXERIL   methylPREDNISolone 4 MG Tbpk tablet Commonly known as: MEDROL DOSEPAK       TAKE these medications    iron polysaccharides 150 MG capsule Commonly known as: NIFEREX Take 1 capsule (150 mg total) by mouth 2 (two) times daily.   pantoprazole 40 MG tablet Commonly known as: Protonix Take 1 tablet (40 mg total) by mouth daily. What changed: when to take this        Follow-up Information     Pabon, Marjory Lies, MD. Go in  3 week(s).   Specialty: General Surgery Why: 9/27 at 9:30 AM Contact information: [address] [place] [address] [phone]                No Known Allergies  Consultations: GI General surgery   Procedures/Studies: CT Angio Abd/Pel W and/or Wo Contrast  Result Date: 08/31/2022 CLINICAL DATA:   Lower GI bleed. EXAM: CTA ABDOMEN AND PELVIS WITHOUT AND WITH CONTRAST TECHNIQUE: Multidetector CT imaging of the abdomen and pelvis was performed using the standard protocol during bolus administration of intravenous contrast. Multiplanar reconstructed images and MIPs were obtained and reviewed to evaluate the vascular anatomy. RADIATION DOSE REDUCTION: This exam was performed according to the departmental dose-optimization program which includes automated exposure control, adjustment of the mA and/or kV according to patient size and/or use of iterative reconstruction technique. CONTRAST:  116m OMNIPAQUE IOHEXOL 350 MG/ML SOLN COMPARISON:  Abdominal CTA 10/15/2019 FINDINGS: VASCULAR Aorta: Normal caliber aorta without aneurysm, dissection, vasculitis or significant stenosis. Celiac: Patent without evidence of aneurysm, dissection, vasculitis or significant stenosis. SMA: Patent without evidence of aneurysm, dissection, vasculitis or significant stenosis. Renals: 2 left and single right renal arteries, all renal arteries are patent. No acute findings or stenosis. IMA: Patent without evidence of aneurysm, dissection, vasculitis or significant stenosis. Inflow: Patent without evidence of aneurysm, dissection, vasculitis or significant stenosis. Proximal Outflow: Bilateral common femoral and visualized portions of the superficial and profunda femoral arteries are patent without evidence of aneurysm, dissection, vasculitis or significant stenosis. Veins: Venous phase imaging demonstrates patency of the portal, splenic, and superior mesenteric veins. Unremarkable appearance of the iliac veins and IVC. No acute venous findings. Review of the MIP images confirms the above findings. NON-VASCULAR Lower chest: Clear lung bases Hepatobiliary: Stable low-density liver lesions, likely representing cysts. There also enhancing foci in the left lobe of the liver that are stable and likely represent flash filling hemangiomas.  These are also unchanged. No specific imaging follow-up is recommended. Unremarkable gallbladder. No biliary dilatation. Pancreas: Unremarkable. No pancreatic ductal dilatation or surrounding inflammatory changes. Spleen: Normal in size without focal abnormality. Adrenals/Urinary Tract: Normal adrenal glands. Unremarkable appearance of the kidneys. Physiologically distended gallbladder. Stomach/Bowel: There is no accumulation of contrast within the GI tract to localize source of GI bleed. No bowel obstruction or inflammation. Moderate colonic stool burden. There may be a few sigmoid colonic diverticula without diverticulitis. Normal appendix Lymphatic: No adenopathy. Reproductive: Prostate is unremarkable. Other: No free air, free fluid, or intra-abdominal fluid collection. Musculoskeletal: There are no acute or suspicious osseous abnormalities. IMPRESSION: 1. No accumulation of contrast within the GI tract to localize source of GI bleed. 2. Minimal sigmoid colonic diverticulosis without diverticulitis. Electronically Signed   By: MKeith RakeM.D.   On: 08/31/2022 20:31      Subjective: Seen and examined on day of discharge.  Stable no distress.  Feels well, back to baseline.  Stable for discharge home.  Discharge Exam: Vitals:   09/03/22 0437 09/03/22 0804  BP: (!) 102/50 117/72  Pulse: 71 (!) 53  Resp: 18 18  Temp: 98.3 F (36.8 C) 98 F (36.7 C)  SpO2: 100% 99%   Vitals:   09/02/22 1724 09/02/22 2015 09/03/22 0437 09/03/22 0804  BP: 127/70 138/71 (!) 102/50 117/72  Pulse: 72 66 71 (!) 53  Resp: '17 18 18 18  '$ Temp:  98.6 F (37 C) 98.3 F (36.8 C) 98 F (36.7 C)  TempSrc:  Oral Oral Oral  SpO2: 100% 100% 100%  99%  Weight:      Height:        General: Pt is alert, awake, not in acute distress Cardiovascular: RRR, S1/S2 +, no rubs, no gallops Respiratory: CTA bilaterally, no wheezing, no rhonchi Abdominal: Soft, NT, ND, bowel sounds + Extremities: no edema, no  cyanosis    The results of significant diagnostics from this hospitalization (including imaging, microbiology, ancillary and laboratory) are listed below for reference.     Microbiology: Recent Results (from the past 240 hour(s))  Resp Panel by RT-PCR (Flu A&B, Covid) Anterior Nasal Swab     Status: None   Collection Time: 08/31/22  6:24 PM   Specimen: Anterior Nasal Swab  Result Value Ref Range Status   SARS Coronavirus 2 by RT PCR NEGATIVE NEGATIVE Final    Comment: (NOTE) SARS-CoV-2 target nucleic acids are NOT DETECTED.  The SARS-CoV-2 RNA is generally detectable in upper respiratory specimens during the acute phase of infection. The lowest concentration of SARS-CoV-2 viral copies this assay can detect is 138 copies/mL. A negative result does not preclude SARS-Cov-2 infection and should not be used as the sole basis for treatment or other patient management decisions. A negative result may occur with  improper specimen collection/handling, submission of specimen other than nasopharyngeal swab, presence of viral mutation(s) within the areas targeted by this assay, and inadequate number of viral copies(<138 copies/mL). A negative result must be combined with clinical observations, patient history, and epidemiological information. The expected result is Negative.  Fact Sheet for Patients:  EntrepreneurPulse.com.au  Fact Sheet for Healthcare Providers:  IncredibleEmployment.be  This test is no t yet approved or cleared by the [place] FDA and  has been authorized for detection and/or diagnosis of SARS-CoV-2 by FDA under an Emergency Use Authorization (EUA). This EUA will remain  in effect (meaning this test can be used) for the duration of the COVID-19 declaration under Section 564(b)(1) of the Act, 21 U.S.C.section 360bbb-3(b)(1), unless the authorization is terminated  or revoked sooner.       Influenza A by PCR NEGATIVE NEGATIVE  Final   Influenza B by PCR NEGATIVE NEGATIVE Final    Comment: (NOTE) The Xpert Xpress SARS-CoV-2/FLU/RSV plus assay is intended as an aid in the diagnosis of influenza from Nasopharyngeal swab specimens and should not be used as a sole basis for treatment. Nasal washings and aspirates are unacceptable for Xpert Xpress SARS-CoV-2/FLU/RSV testing.  Fact Sheet for Patients: EntrepreneurPulse.com.au  Fact Sheet for Healthcare Providers: IncredibleEmployment.be  This test is not yet approved or cleared by the [place] FDA and has been authorized for detection and/or diagnosis of SARS-CoV-2 by FDA under an Emergency Use Authorization (EUA). This EUA will remain in effect (meaning this test can be used) for the duration of the COVID-19 declaration under Section 564(b)(1) of the Act, 21 U.S.C. section 360bbb-3(b)(1), unless the authorization is terminated or revoked.  Performed at [facility], [address] [place], [place] [address]      Labs: BNP (last 3 results) No results for input(s): "BNP" in the last 8760 hours. Basic Metabolic Panel: Recent Labs  Lab 08/31/22 1824 09/01/22 0248  NA 138 137  K 3.7 3.7  CL 109 109  CO2 26 24  GLUCOSE 91 96  BUN 9 8  CREATININE 1.05 1.12  CALCIUM 8.2* 8.2*   Liver Function Tests: Recent Labs  Lab 08/31/22 1824 09/01/22 0248  AST 19 18  ALT 12 12  ALKPHOS 43 42  BILITOT 0.7 1.0  PROT 6.1* 5.9*  ALBUMIN 3.6 3.4*   No results for input(s): "LIPASE", "AMYLASE" in the last 168 hours. No results for input(s): "AMMONIA" in the last 168 hours. CBC: Recent Labs  Lab 08/31/22 1824 09/01/22 0248 09/01/22 1827 09/02/22 0505 09/03/22 0458  WBC 4.1 4.4  --  5.6 6.1  NEUTROABS 1.6* 1.6*  --   --  3.5  HGB 4.8* 6.9* 8.9* 8.3* 8.9*  HCT 16.9* 22.3* 28.2* 26.1* 28.0*  MCV 70.4* 73.4*  --  73.3* 74.3*  PLT 240 212  --  250 260   Cardiac Enzymes: No results for input(s):  "CKTOTAL", "CKMB", "CKMBINDEX", "TROPONINI" in the last 168 hours. BNP: Invalid input(s): "POCBNP" CBG: No results for input(s): "GLUCAP" in the last 168 hours. D-Dimer No results for input(s): "DDIMER" in the last 72 hours. Hgb A1c No results for input(s): "HGBA1C" in the last 72 hours. Lipid Profile No results for input(s): "CHOL", "HDL", "LDLCALC", "TRIG", "CHOLHDL", "LDLDIRECT" in the last 72 hours. Thyroid function studies No results for input(s): "TSH", "T4TOTAL", "T3FREE", "THYROIDAB" in the last 72 hours.  Invalid input(s): "FREET3" Anemia work up Recent Labs    09/01/22 0248 09/02/22 0506 09/02/22 1544  VITAMINB12  --   --  216  FOLATE  --  3.6*  --   FERRITIN 2*  --   --   TIBC 374  --   --   IRON 13*  --   --    Urinalysis    Component Value Date/Time   COLORURINE YELLOW (A) 11/01/2018 1519   APPEARANCEUR CLEAR (A) 11/01/2018 1519   LABSPEC 1.024 11/01/2018 1519   PHURINE 6.0 11/01/2018 1519   GLUCOSEU NEGATIVE 11/01/2018 1519   HGBUR NEGATIVE 11/01/2018 1519   BILIRUBINUR NEGATIVE 11/01/2018 1519   KETONESUR NEGATIVE 11/01/2018 1519   PROTEINUR NEGATIVE 11/01/2018 1519   NITRITE NEGATIVE 11/01/2018 1519   LEUKOCYTESUR NEGATIVE 11/01/2018 1519   Sepsis Labs Recent Labs  Lab 08/31/22 1824 09/01/22 0248 09/02/22 0505 09/03/22 0458  WBC 4.1 4.4 5.6 6.1   Microbiology Recent Results (from the past 240 hour(s))  Resp Panel by RT-PCR (Flu A&B, Covid) Anterior Nasal Swab     Status: None   Collection Time: 08/31/22  6:24 PM   Specimen: Anterior Nasal Swab  Result Value Ref Range Status   SARS Coronavirus 2 by RT PCR NEGATIVE NEGATIVE Final    Comment: (NOTE) SARS-CoV-2 target nucleic acids are NOT DETECTED.  The SARS-CoV-2 RNA is generally detectable in upper respiratory specimens during the acute phase of infection. The lowest concentration of SARS-CoV-2 viral copies this assay can detect is 138 copies/mL. A negative result does not preclude  SARS-Cov-2 infection and should not be used as the sole basis for treatment or other patient management decisions. A negative result may occur with  improper specimen collection/handling, submission of specimen other than nasopharyngeal swab, presence of viral mutation(s) within the areas targeted by this assay, and inadequate number of viral copies(<138 copies/mL). A negative result must be combined with clinical observations, patient history, and epidemiological information. The expected result is Negative.  Fact Sheet for Patients:  EntrepreneurPulse.com.au  Fact Sheet for Healthcare Providers:  IncredibleEmployment.be  This test is no t yet approved or cleared by the [place] FDA and  has been authorized for detection and/or diagnosis of SARS-CoV-2 by FDA under an Emergency Use Authorization (EUA). This EUA will remain  in effect (meaning this test can be used) for the duration of the COVID-19 declaration under Section 564(b)(1)  of the Act, 21 U.S.C.section 360bbb-3(b)(1), unless the authorization is terminated  or revoked sooner.       Influenza A by PCR NEGATIVE NEGATIVE Final   Influenza B by PCR NEGATIVE NEGATIVE Final    Comment: (NOTE) The Xpert Xpress SARS-CoV-2/FLU/RSV plus assay is intended as an aid in the diagnosis of influenza from Nasopharyngeal swab specimens and should not be used as a sole basis for treatment. Nasal washings and aspirates are unacceptable for Xpert Xpress SARS-CoV-2/FLU/RSV testing.  Fact Sheet for Patients: EntrepreneurPulse.com.au  Fact Sheet for Healthcare Providers: IncredibleEmployment.be  This test is not yet approved or cleared by the [place] FDA and has been authorized for detection and/or diagnosis of SARS-CoV-2 by FDA under an Emergency Use Authorization (EUA). This EUA will remain in effect (meaning this test can be used) for the duration of  the COVID-19 declaration under Section 564(b)(1) of the Act, 21 U.S.C. section 360bbb-3(b)(1), unless the authorization is terminated or revoked.  Performed at [facility], [address], [place], [place] [address]      Time coordinating discharge: Over 30 minutes  SIGNED:   Sidney Ace, MD  [facility] 09/03/2022, 10:39 AM Pager   If 7PM-7AM, please contact night-coverage

## 2022-09-07 LAB — SURGICAL PATHOLOGY

## 2022-09-23 ENCOUNTER — Inpatient Hospital Stay: Payer: Managed Care, Other (non HMO)

## 2022-09-23 ENCOUNTER — Inpatient Hospital Stay: Payer: Managed Care, Other (non HMO) | Admitting: Surgery

## 2022-09-23 ENCOUNTER — Inpatient Hospital Stay: Payer: Managed Care, Other (non HMO) | Attending: Oncology | Admitting: Oncology

## 2023-07-16 ENCOUNTER — Ambulatory Visit: Payer: Managed Care, Other (non HMO)

## 2023-07-26 ENCOUNTER — Ambulatory Visit: Payer: Managed Care, Other (non HMO) | Admitting: Family Medicine

## 2023-07-26 DIAGNOSIS — Z202 Contact with and (suspected) exposure to infections with a predominantly sexual mode of transmission: Secondary | ICD-10-CM

## 2023-07-26 MED ORDER — METRONIDAZOLE 500 MG PO TABS: 2000.0000 mg | ORAL_TABLET | Freq: Once | ORAL | Status: AC

## 2023-07-26 NOTE — Progress Notes (Addendum)
Pt here as contact to trich.  Condoms given. The patient was dispensed metronidazole today. I provided counseling today regarding the medication. We discussed the medication, the side effects and when to call clinic. Patient given the opportunity to ask questions. Questions answered.  Gaspar Garbe, RN

## 2023-07-26 NOTE — Progress Notes (Signed)
[facility] Department STI clinic/screening visit  Subjective:  Douglas Patton is a 37 y.o. male being seen today for an STI screening visit. The patient reports they do not have symptoms.    Patient has the following medical conditions:   Patient Active Problem List   Diagnosis Date Noted   Iron deficiency anemia due to chronic blood loss 09/03/2022   GI bleed 09/02/2022   Severe anemia 09/01/2022   Hemorrhoids    Acute blood loss anemia 08/31/2022   Rectal bleeding 08/31/2022   Symptomatic anemia 03/02/2018   TOBACCO DEPENDENCE 02/24/2007   RHINITIS, ALLERGIC 02/24/2007   ACNE 02/24/2007     Chief Complaint  Patient presents with   Exposure to STD    Contact to trich     HPI  Patient reports to clinic as a contact to [place]. Declines exam or testing.   Last HIV test per patient/review of record was No results found for: "HMHIVSCREEN"  Lab Results  Component Value Date   HIV Non Reactive 09/01/2022    Does the patient or their partner desires a pregnancy in the next year? No  Screening for MPX risk: Does the patient have an unexplained rash? No Is the patient MSM? No Does the patient endorse multiple sex partners or anonymous sex partners? No Did the patient have close or sexual contact with a person diagnosed with MPX? No Has the patient traveled outside the [place] where MPX is endemic? No Is there a high clinical suspicion for MPX-- evidenced by one of the following No  -Unlikely to be chickenpox  -Lymphadenopathy  -Rash that present in same phase of evolution on any given body part   See flowsheet for further details and programmatic requirements.    There is no immunization history on file for this patient.   The following portions of the patient's history were reviewed and updated as appropriate: allergies, current medications, past medical history, past social history, past surgical history and problem list.  Objective:  There were no  vitals filed for this visit.  Physical Exam  Declined  Assessment and Plan:  Douglas Patton is a 37 y.o. male presenting to the [facility] Department for STI screening  1. Exposure to trichomonas  - metroNIDAZOLE (FLAGYL) 500 MG tablet; Take 4 tablets (2,000 mg total) by mouth once for 1 dose.    Patient does not have STI symptoms Patient accepted all screenings including  urine GC/Chlamydia, and blood work for HIV/Syphilis. Patient meets criteria for HepB screening? No. Ordered? not applicable Patient meets criteria for HepC screening? No. Ordered? not applicable Recommended condom use with all sex Discussed importance of condom use for STI prevent  Treat positive test results per standing order. Discussed time line for State Lab results and that patient will be called with positive results and encouraged patient to call if he had not heard in 2 weeks Recommended repeat testing in 3 months with positive results. Recommended returning for continued or worsening symptoms.   Return if symptoms worsen or fail to improve, for STI screening.  No future appointments. Total time spent 20 minutes Lenice Llamas, [place]

## 2024-05-07 ENCOUNTER — Other Ambulatory Visit: Payer: Self-pay

## 2024-05-07 ENCOUNTER — Emergency Department

## 2024-05-07 ENCOUNTER — Emergency Department
Admission: EM | Admit: 2024-05-07 | Discharge: 2024-05-07 | Disposition: A | Discharge status: Home / Self Care | Attending: Emergency Medicine | Admitting: Emergency Medicine

## 2024-05-07 DIAGNOSIS — R002 Palpitations: Secondary | ICD-10-CM | POA: Insufficient documentation

## 2024-05-07 DIAGNOSIS — R079 Chest pain, unspecified: Secondary | ICD-10-CM | POA: Diagnosis present

## 2024-05-07 LAB — CBC
HCT: 36.1 % — ABNORMAL LOW (ref 39.0–52.0)
Hemoglobin: 10.7 g/dL — ABNORMAL LOW (ref 13.0–17.0)
MCH: 21.5 pg — ABNORMAL LOW (ref 26.0–34.0)
MCHC: 29.6 g/dL — ABNORMAL LOW (ref 30.0–36.0)
MCV: 72.6 fL — ABNORMAL LOW (ref 80.0–100.0)
Platelets: 186 10*3/uL (ref 150–400)
RBC: 4.97 MIL/uL (ref 4.22–5.81)
RDW: 19.5 % — ABNORMAL HIGH (ref 11.5–15.5)
WBC: 4.9 10*3/uL (ref 4.0–10.5)
nRBC: 0 % (ref 0.0–0.2)

## 2024-05-07 LAB — BASIC METABOLIC PANEL WITH GFR
Anion gap: 8 (ref 5–15)
BUN: 12 mg/dL (ref 6–20)
CO2: 24 mmol/L (ref 22–32)
Calcium: 9 mg/dL (ref 8.9–10.3)
Chloride: 105 mmol/L (ref 98–111)
Creatinine, Ser: 1.1 mg/dL (ref 0.61–1.24)
GFR, Estimated: >60 mL/min (ref >60)
Glucose, Bld: 97 mg/dL (ref 70–99)
Potassium: 4.1 mmol/L (ref 3.5–5.1)
Sodium: 137 mmol/L (ref 135–145)

## 2024-05-07 LAB — TROPONIN I (HIGH SENSITIVITY): Troponin I (High Sensitivity): 3 ng/L (ref <18)

## 2024-05-07 MED ORDER — PROPRANOLOL HCL 10 MG PO TABS: 10.0000 mg | ORAL_TABLET | Freq: Two times a day (BID) | ORAL | 0 refills | Status: DC | PRN

## 2024-05-07 NOTE — ED Provider Notes (Signed)
  Physical Exam  BP (!) 148/84 (BP Location: Left Arm)   Pulse 81   Temp 98.4 F (36.9 C) (Oral)   Resp 17   Ht 6' (1.829 m)   Wt 90.7 kg   SpO2 100%   BMI 27.12 kg/m   Physical Exam  Procedures  Procedures  ED Course / MDM    Medical Decision Making Amount and/or Complexity of Data Reviewed Labs: ordered. Radiology: ordered.  Risk Prescription drug management.   Received signout on patient.  38 year old male presenting today with feelings of chest palpitations.  Seen at [facility] 1 month ago for similar symptoms and started on a muscle relaxer without benefit.  Initial provider noted largely reassuring exam with unremarkable EKG and stable vital signs.  CBC and BMP largely reassuring and chest x-ray negative.  He was signed out pending troponin.  Troponin negative no concerns for ischemia given the long duration I do not feel the need to repeat at this time.  Patient was reassessed and rather asymptomatic.  Plan was to start on propranolol and have him follow-up with cardiology outpatient.  He is agreeable with this plan and given strict return precautions.     Kandee Orion, MD 05/07/24 1556

## 2024-05-07 NOTE — ED Provider Notes (Signed)
 [facility] Provider Note    Event Date/Time   First MD Initiated Contact with Patient 05/07/24 1312     (approximate)  History   Chief Complaint: Chest Pain  HPI  Douglas Patton is a 38 y.o. male with a past medical history of anemia, presents to the emergency department for chest discomfort.  According to the patient over the past month or so he has been experiencing discomfort in the chest which he describes as more of a pounding at times or pressure feeling in the chest.  States it feels like his heart is "gurgling."  Patient states he was seen at [facility] last month for the same was diagnosed with possible chest wall pain and prescribed a muscle relaxer which the patient states has not really helped with his current symptoms.  Physical Exam   Triage Vital Signs: ED Triage Vitals [05/07/24 1302]  Encounter Vitals Group     BP (!) 148/84     Systolic BP Percentile      Diastolic BP Percentile      Pulse Rate 81     Resp 17     Temp 98.4 F (36.9 C)     Temp Source Oral     SpO2 100 %     Weight 200 lb (90.7 kg)     Height 6' (1.829 m)     Head Circumference      Peak Flow      Pain Score 4     Pain Loc      Pain Education      Exclude from Growth Chart     Most recent vital signs: Vitals:   05/07/24 1302  BP: (!) 148/84  Pulse: 81  Resp: 17  Temp: 98.4 F (36.9 C)  SpO2: 100%    General: Awake, no distress.  CV:  Good peripheral perfusion.  Regular rate and rhythm  Resp:  Normal effort.  Equal breath sounds bilaterally.  Abd:  No distention.  Soft, nontender.  No rebound or guarding.  ED Results / Procedures / Treatments   EKG  EKG viewed and interpreted by myself shows a normal sinus rhythm at 83 bpm with a narrow QRS, normal axis, normal intervals, no concerning ST changes.  RADIOLOGY  I have reviewed interpreted chest x-ray images.  No consolidation on my evaluation.    MEDICATIONS ORDERED IN ED: Medications - No data  to display   IMPRESSION / MDM / ASSESSMENT AND PLAN / ED COURSE  I reviewed the triage vital signs and the nursing notes.  Patient's presentation is most consistent with acute presentation with potential threat to life or bodily function.  Patient presents to the emergency department with complaints of intermittent chest discomfort.  Patient's description of the discomfort seems most consistent with palpitations.  He does state occasional pressure sensation in the chest as well states symptoms have been ongoing over the past 1 month or so.  We will check labs including CBC chemistry and troponin.  Will obtain an EKG and a chest x-ray.  Patient agreeable to plan of care.  Patient's EKG is reassuring.  We will continue to monitor on cardiac monitoring. CBC shows mild anemia, this appears to be chronic per record review.  Chemistry reassuring.  Chest x-ray reassuring.  Awaiting troponin result.  If negative anticipate discharge on propranolol have the patient follow-up with his doctor.  We will also provide a cardiology referral for the patient. FINAL CLINICAL IMPRESSION(S) / ED DIAGNOSES  Palpitations Chest pain   Note:  This document was prepared using Dragon voice recognition software and may include unintentional dictation errors.   Ruth Cove, MD 05/07/24 1513

## 2024-05-07 NOTE — ED Triage Notes (Signed)
 Pt sts that he has been having CP on and off for the last month. Pt sts that he went to [facility] and they said things are fine however pt sts that the CP has come back. Pt sts that the pain is in the left chest and he feels dizzy. Pt denies any N,V,D.

## 2024-05-07 NOTE — Discharge Instructions (Signed)
 As we discussed please use your propranolol if needed for palpitations, as written.  Please call the number provided for cardiology to arrange a follow-up appointment.  Return to the emergency department for any further chest pain, trouble breathing or any other symptom personally concerning to yourself.

## 2024-05-28 ENCOUNTER — Telehealth: Admitting: Family

## 2024-05-28 DIAGNOSIS — R002 Palpitations: Secondary | ICD-10-CM

## 2024-05-28 MED ORDER — PROPRANOLOL HCL 10 MG PO TABS: 10.0000 mg | ORAL_TABLET | Freq: Two times a day (BID) | ORAL | 0 refills | Status: DC | PRN

## 2024-05-28 NOTE — Progress Notes (Signed)
 Virtual Visit Consent   Douglas Patton, you are scheduled for a virtual visit with a [facility] provider today. Just as with appointments in the office, your consent must be obtained to participate. Your consent will be active for this visit and any virtual visit you may have with one of our providers in the next 365 days. If you have a MyChart account, a copy of this consent can be sent to you electronically.  As this is a virtual visit, video technology does not allow for your provider to perform a traditional examination. This may limit your provider's ability to fully assess your condition. If your provider identifies any concerns that need to be evaluated in person or the need to arrange testing (such as labs, EKG, etc.), we will make arrangements to do so. Although advances in technology are sophisticated, we cannot ensure that it will always work on either your end or our end. If the connection with a video visit is poor, the visit may have to be switched to a telephone visit. With either a video or telephone visit, we are not always able to ensure that we have a secure connection.  By engaging in this virtual visit, you consent to the provision of healthcare and authorize for your insurance to be billed (if applicable) for the services provided during this visit. Depending on your insurance coverage, you may receive a charge related to this service.  I need to obtain your verbal consent now. Are you willing to proceed with your visit today? Douglas Patton has provided verbal consent on 05/28/2024 for a virtual visit (video or telephone). Tommas Fragmin, FNP  Date: 05/28/2024 9:01 AM   Virtual Visit via Video Note   I, Tommas Fragmin, connected with  Douglas Patton  ([id number], 11/12/1986) on 05/28/24 at  9:00 AM EDT by a video-enabled telemedicine application and verified that I am speaking with the correct person using two identifiers.  Location: Patient: Virtual Visit Location  Patient: Home Provider: Virtual Visit Location Provider: Home Office   I discussed the limitations of evaluation and management by telemedicine and the availability of in person appointments. The patient expressed understanding and agreed to proceed.    History of Present Illness: Douglas Patton is a 38 y.o. who identifies as a male who was assigned male at birth, and is being seen today for refill of his propranolol  10 mg BID. He reports he took his last dose last night. Takes this for palpitations.  He was given by the ED, but has an appointment to establish care with a PCP at the end of this month.  HPI: Palpitations  This is a chronic problem. The current episode started more than 1 month ago. The problem occurs intermittently. He has tried beta blockers for the symptoms. The treatment provided moderate relief.    Problems:  Patient Active Problem List   Diagnosis Date Noted   Iron  deficiency anemia due to chronic blood loss 09/03/2022   GI bleed 09/02/2022   Severe anemia 09/01/2022   Hemorrhoids    Acute blood loss anemia 08/31/2022   Rectal bleeding 08/31/2022   Symptomatic anemia 03/02/2018   TOBACCO DEPENDENCE 02/24/2007   RHINITIS, ALLERGIC 02/24/2007   ACNE 02/24/2007    Allergies: No Known Allergies Medications:  Current Outpatient Medications:    iron  polysaccharides (NIFEREX) 150 MG capsule, Take 1 capsule (150 mg total) by mouth 2 (two) times daily., Disp: 120 capsule, Rfl: 0   pantoprazole  (PROTONIX ) 40 MG tablet,  Take 1 tablet (40 mg total) by mouth daily., Disp: 30 tablet, Rfl: 0   propranolol  (INDERAL ) 10 MG tablet, Take 1 tablet (10 mg total) by mouth 2 (two) times daily as needed (palpitations)., Disp: 180 tablet, Rfl: 0  Observations/Objective: Patient is well-developed, well-nourished in no acute distress.  Resting comfortably  at home.  Head is normocephalic, atraumatic.  No labored breathing.  Speech is clear and coherent with logical content.   Patient is alert and oriented at baseline.    Assessment and Plan: 1. Palpitations (Primary) - propranolol  (INDERAL ) 10 MG tablet; Take 1 tablet (10 mg total) by mouth 2 (two) times daily as needed (palpitations).  Dispense: 180 tablet; Refill: 0  Will refill today Keep follow up with PCP to establish care Limit caffeine Stress management Follow up if symptoms worsen or do not improve    Follow Up Instructions: I discussed the assessment and treatment plan with the patient. The patient was provided an opportunity to ask questions and all were answered. The patient agreed with the plan and demonstrated an understanding of the instructions.  A copy of instructions were sent to the patient via MyChart unless otherwise noted below.     The patient was advised to call back or seek an in-person evaluation if the symptoms worsen or if the condition fails to improve as anticipated.    Tommas Fragmin, FNP

## 2024-07-18 LAB — CBC AND DIFFERENTIAL
HCT: 40 — AB (ref 41–53)
Hemoglobin: 11.6 — AB (ref 13.5–17.5)
Platelets: 201 K/uL (ref 150–400)
WBC: 5.4

## 2024-07-18 LAB — CBC: RBC: 5.21 — AB (ref 3.87–5.11)

## 2024-07-20 ENCOUNTER — Ambulatory Visit
Admission: RE | Admit: 2024-07-20 | Discharge: 2024-07-20 | Disposition: A | Discharge status: Home / Self Care | Attending: Nurse Practitioner | Admitting: Nurse Practitioner

## 2024-07-20 ENCOUNTER — Encounter: Payer: Self-pay | Admitting: Nurse Practitioner

## 2024-07-20 ENCOUNTER — Ambulatory Visit: Payer: Self-pay | Admitting: Nurse Practitioner

## 2024-07-20 ENCOUNTER — Ambulatory Visit: Attending: Nurse Practitioner

## 2024-07-20 ENCOUNTER — Ambulatory Visit (INDEPENDENT_AMBULATORY_CARE_PROVIDER_SITE_OTHER): Admitting: Nurse Practitioner

## 2024-07-20 ENCOUNTER — Ambulatory Visit
Admission: RE | Admit: 2024-07-20 | Discharge: 2024-07-20 | Disposition: A | Discharge status: Home / Self Care | Source: Ambulatory Visit | Attending: Nurse Practitioner | Admitting: Nurse Practitioner

## 2024-07-20 VITALS — BP 122/72 | HR 84 | Temp 98.1°F | Resp 18 | Ht 72.0 in | Wt 190.0 lb

## 2024-07-20 DIAGNOSIS — R002 Palpitations: Secondary | ICD-10-CM | POA: Insufficient documentation

## 2024-07-20 DIAGNOSIS — G5603 Carpal tunnel syndrome, bilateral upper limbs: Secondary | ICD-10-CM | POA: Insufficient documentation

## 2024-07-20 DIAGNOSIS — Z7689 Persons encountering health services in other specified circumstances: Secondary | ICD-10-CM | POA: Diagnosis not present

## 2024-07-20 DIAGNOSIS — G8929 Other chronic pain: Secondary | ICD-10-CM | POA: Insufficient documentation

## 2024-07-20 DIAGNOSIS — R0683 Snoring: Secondary | ICD-10-CM

## 2024-07-20 DIAGNOSIS — M25512 Pain in left shoulder: Secondary | ICD-10-CM | POA: Diagnosis not present

## 2024-07-20 DIAGNOSIS — K219 Gastro-esophageal reflux disease without esophagitis: Secondary | ICD-10-CM

## 2024-07-20 MED ORDER — OMEPRAZOLE 20 MG PO CPDR
20.0000 mg | DELAYED_RELEASE_CAPSULE | Freq: Every day | ORAL | 1 refills | Status: AC
Start: 2024-07-20 — End: ?

## 2024-07-20 MED ORDER — PROPRANOLOL HCL 10 MG PO TABS: 10.0000 mg | ORAL_TABLET | Freq: Two times a day (BID) | ORAL | 1 refills | Status: AC | PRN

## 2024-07-20 MED ORDER — NAPROXEN 500 MG PO TABS: 500.0000 mg | ORAL_TABLET | Freq: Two times a day (BID) | ORAL | 0 refills | Status: AC

## 2024-07-20 NOTE — Progress Notes (Signed)
 BP 122/72   Pulse 84   Temp 98.1 F (36.7 C)   Resp 18   Ht 6' (1.829 m)   Wt 190 lb (86.2 kg)   SpO2 96%   BMI 25.77 kg/m    Subjective:    Patient ID: Douglas Patton, male    DOB: Feb 13, 1986, 38 y.o.   MRN: [id number]  HPI: UMAIR Patton is a 38 y.o. male  Chief Complaint  Patient presents with   Establish Care   Shoulder Pain    Left, 3-4 days   Hand Pain    3-4 days   Palpitations    Ongoing has went to ER normal labs and EKG    Discussed the use of AI scribe software for clinical note transcription with the patient, who gave verbal consent to proceed.  History of Present Illness ZYREN Patton is a 38 year old male who presents to establish care for palpitations and anemia.  Palpitations and chest pressure - Palpitations present since earlier this year, initially occurring at work - Sensation described as heart stopping briefly, accompanied by chest pressure - No associated shortness of breath or diaphoresis - History of anxiety, which exacerbates palpitations - Initially evaluated at [facility] with blood work only; further evaluation at [facility] - Prescribed propranolol , initially taking two doses daily, now mostly one dose daily, with some days not requiring medication - No family history of cardiac disease; father has history of strokes, mother has hypertension  Anemia and gastrointestinal bleeding - History of anemia identified during previous evaluations - Multiple colonoscopies and an endoscopy performed due to rectal bleeding - Findings include bulbar duodenitis, colonic polyps, sequel polyps, diverticulosis, and internal hemorrhoids - Recent tests have not identified a clear source of bleeding - Certain dietary triggers, such as acidic foods, exacerbate symptoms, particularly heartburn, which is used as an indicator for potential bleeding - Manages anemia with over-the-counter iron  supplements and iron -rich diet  Left shoulder and hand  pain - Left shoulder and hand pain present for the past 3-4 days - History of shoulder issues dating back to high school football - Pain described as a grinding sensation in the shoulder with limited range of motion - Occasional numbness in the fingers, particularly after using tools at work - Pain not localized to the neck - Pain varies in intensity and location in the fingers - No current medication for shoulder pain  Sleep disturbances and suspected sleep apnea - Sleep disturbances present for the past six months - Partner has observed episodes of apnea during sleep - No specific treatment for sleep disturbances or suspected sleep apnea     Waist Measurement : 34 inches     07/20/2024    9:05 AM  Depression screen PHQ 2/9  Decreased Interest 0  Down, Depressed, Hopeless 0  PHQ - 2 Score 0  Altered sleeping 0  Tired, decreased energy 0  Change in appetite 0  Feeling bad or failure about yourself  0  Trouble concentrating 0  Moving slowly or fidgety/restless 0  Suicidal thoughts 0  PHQ-9 Score 0  Difficult doing work/chores Not difficult at all    Relevant past medical, surgical, family and social history reviewed and updated as indicated. Interim medical history since our last visit reviewed. Allergies and medications reviewed and updated.  Review of Systems  Ten systems reviewed and is negative except as mentioned in HPI      Objective:     BP 122/72   Pulse 84  Temp 98.1 F (36.7 C)   Resp 18   Ht 6' (1.829 m)   Wt 190 lb (86.2 kg)   SpO2 96%   BMI 25.77 kg/m    Wt Readings from Last 3 Encounters:  07/20/24 190 lb (86.2 kg)  05/07/24 200 lb (90.7 kg)  09/02/22 171 lb (77.6 kg)    Physical Exam Physical Exam GENERAL: Alert, cooperative, well developed, no acute distress. HEENT: Normocephalic, normal oropharynx, moist mucous membranes. CHEST: Clear to auscultation bilaterally, no wheezes, rhonchi, or crackles. CARDIOVASCULAR: Normal heart rate and  rhythm, S1 and S2 normal without murmurs. ABDOMEN: Soft, non-tender, non-distended, without organomegaly, normal bowel sounds. EXTREMITIES: No cyanosis or edema. MUSCULOSKELETAL: No tenderness on shoulder palpation, crepitus in shoulder on palpation, no numbness with wrist compression. NEUROLOGICAL: Cranial nerves grossly intact, moves all extremities without gross motor or sensory deficit.   Results for orders placed or performed during the hospital encounter of 05/07/24  Basic metabolic panel   Collection Time: 05/07/24  1:30 PM  Result Value Ref Range   Sodium 137 135 - 145 mmol/L   Potassium 4.1 3.5 - 5.1 mmol/L   Chloride 105 98 - 111 mmol/L   CO2 24 22 - 32 mmol/L   Glucose, Bld 97 70 - 99 mg/dL   BUN 12 6 - 20 mg/dL   Creatinine, Ser 8.89 0.61 - 1.24 mg/dL   Calcium 9.0 8.9 - 89.6 mg/dL   GFR, Estimated >39 >39 mL/min   Anion gap 8 5 - 15  CBC   Collection Time: 05/07/24  1:30 PM  Result Value Ref Range   WBC 4.9 4.0 - 10.5 K/uL   RBC 4.97 4.22 - 5.81 MIL/uL   Hemoglobin 10.7 (L) 13.0 - 17.0 g/dL   HCT 63.8 (L) 60.9 - 47.9 %   MCV 72.6 (L) 80.0 - 100.0 fL   MCH 21.5 (L) 26.0 - 34.0 pg   MCHC 29.6 (L) 30.0 - 36.0 g/dL   RDW 80.4 (H) 88.4 - 84.4 %   Platelets 186 150 - 400 K/uL   nRBC 0.0 0.0 - 0.2 %  Troponin I (High Sensitivity)   Collection Time: 05/07/24  1:30 PM  Result Value Ref Range   Troponin I (High Sensitivity) 3 <18 ng/L          Assessment & Plan:   Problem List Items Addressed This Visit       Nervous and Auditory   Bilateral carpal tunnel syndrome     Other   Chronic left shoulder pain - Primary   Relevant Medications   naproxen  (NAPROSYN ) 500 MG tablet   Other Relevant Orders   DG Shoulder Left   Palpitations   Relevant Medications   propranolol  (INDERAL ) 10 MG tablet   Other Relevant Orders   LONG TERM MONITOR (3-14 DAYS)   Other Visit Diagnoses       Encounter to establish care         Gastroesophageal reflux disease without  esophagitis       Relevant Medications   omeprazole  (PRILOSEC) 20 MG capsule     Snoring       Relevant Orders   Ambulatory referral to Pulmonology        Assessment and Plan Assessment & Plan Palpitations Intermittent palpitations with associated chest pressure. No family history of cardiac problems. Previous EKG was normal. Anemia and thyroid function are not contributing factors. Propranolol  has been effective in reducing frequency and severity of episodes. - Order Zio patch for continuous  heart monitoring for two weeks - Refill propranolol  prescription  Anemia, unspecified Anemia with improvement. Previous colonoscopy and endoscopy were unremarkable. Dietary modifications and iron  supplementation are being followed. -taking iron  OTC  Gastroesophageal reflux disease without esophagitis Heartburn exacerbated by acidic foods. Avoids red sauces and opts for alternatives like barbecue chicken pizza. Safe to take heartburn medication with propranolol . - Start omeprazole  for heartburn management  Left shoulder pain Chronic left shoulder pain with limited range of motion and grinding sensation. Pain exacerbated by certain movements and activities. No recent imaging. - Order left shoulder x-ray - Prescribe naproxen  for 10 days for inflammation - Advise to take naproxen  with food to protect stomach  Bilateral carpal tunnel syndrome Numbness and pain in fingers, exacerbated by work activities. Symptoms suggestive of carpal tunnel syndrome. - Recommend wrist splints for symptom management - Suggest use of over-the-counter Voltaren gel for pain relief  Suspected obstructive sleep apnea Reported episodes of apnea during sleep. Propranolol  may have reduced frequency of episodes. - Refer to pulmonology for sleep study        Follow up plan: Return in about 3 months (around 10/20/2024) for follow up.

## 2024-07-24 ENCOUNTER — Ambulatory Visit: Admitting: Sleep Medicine

## 2024-07-25 ENCOUNTER — Encounter: Payer: Self-pay | Admitting: Sleep Medicine

## 2024-08-18 DIAGNOSIS — R002 Palpitations: Secondary | ICD-10-CM

## 2024-10-23 ENCOUNTER — Ambulatory Visit: Admitting: Nurse Practitioner

## 2024-10-27 ENCOUNTER — Encounter: Payer: Self-pay | Admitting: Nurse Practitioner

## 2024-10-27 ENCOUNTER — Ambulatory Visit: Admitting: Nurse Practitioner

## 2024-10-27 VITALS — BP 138/86 | HR 80 | Temp 98.0°F | Ht 72.0 in | Wt 195.0 lb

## 2024-10-27 DIAGNOSIS — Z130 Encounter for screening for diseases of the blood and blood-forming organs and certain disorders involving the immune mechanism: Secondary | ICD-10-CM

## 2024-10-27 DIAGNOSIS — R002 Palpitations: Secondary | ICD-10-CM | POA: Diagnosis not present

## 2024-10-27 DIAGNOSIS — D509 Iron deficiency anemia, unspecified: Secondary | ICD-10-CM

## 2024-10-27 DIAGNOSIS — E538 Deficiency of other specified B group vitamins: Secondary | ICD-10-CM | POA: Diagnosis not present

## 2024-10-27 DIAGNOSIS — K219 Gastro-esophageal reflux disease without esophagitis: Secondary | ICD-10-CM | POA: Insufficient documentation

## 2024-10-27 DIAGNOSIS — Z131 Encounter for screening for diabetes mellitus: Secondary | ICD-10-CM

## 2024-10-27 DIAGNOSIS — M25512 Pain in left shoulder: Secondary | ICD-10-CM

## 2024-10-27 DIAGNOSIS — Z23 Encounter for immunization: Secondary | ICD-10-CM

## 2024-10-27 DIAGNOSIS — M79605 Pain in left leg: Secondary | ICD-10-CM

## 2024-10-27 DIAGNOSIS — Z13220 Encounter for screening for lipoid disorders: Secondary | ICD-10-CM

## 2024-10-27 DIAGNOSIS — G8929 Other chronic pain: Secondary | ICD-10-CM

## 2024-10-27 MED ORDER — METHOCARBAMOL 500 MG PO TABS: 500.0000 mg | ORAL_TABLET | Freq: Three times a day (TID) | ORAL | 0 refills | Status: AC

## 2024-10-27 MED ORDER — CELECOXIB 100 MG PO CAPS: 100.0000 mg | ORAL_CAPSULE | Freq: Two times a day (BID) | ORAL | 0 refills | Status: AC

## 2024-10-27 NOTE — Progress Notes (Signed)
 BP 138/86   Pulse 80   Temp 98 F (36.7 C)   Ht 6' (1.829 m)   Wt 195 lb (88.5 kg)   SpO2 98%   BMI 26.45 kg/m    Subjective:    Patient ID: Douglas Patton, male    DOB: [birth year], 38 y.o.   MRN: [id number]  HPI: Douglas Patton is a 38 y.o. male  Chief Complaint  Patient presents with   Medical Management of Chronic Issues    Pt would like to follow up about left leg issue.    Discussed the use of AI scribe software for clinical note transcription with the patient, who gave verbal consent to proceed.  History of Present Illness Douglas Patton is a 38 year old male who presents for a three-month follow-up.  Palpitations and sleep quality - Palpitations managed with propranolol  10 mg twice daily as needed - Two doses of propranolol  taken in the last two weeks - No recent episodes of nocturnal palpitations - Improved sleep quality  Gastroesophageal reflux disease (gerd) - GERD well-controlled with omeprazole  20 mg daily - No current symptoms of acid reflux  Iron  deficiency anemia and associated symptoms - Iron  deficiency anemia managed with daily iron  supplementation - History of blood transfusion with symptom improvement, including relief of sciatic nerve pain - Concern about anemia exacerbating current symptoms  Chronic left shoulder pain - Chronic pain in the left shoulder - Previous x-rays showed no abnormalities - Pain unchanged - Managed with naproxen , providing partial relief  Left leg pain/thigh - Persistent pressure in the posterior thigh, onset approximately one month ago, initially started in the calf - Sensation described as a 'Charlie horse' that never fully develops - Symptoms worsen by the end of the day - Relief with posture adjustment - No prior treatment sought for this issue  Sleep apnea symptoms - Episodes of apnea during sleep - Improvement in symptoms - Previously declined referral for sleep study  Vitamin b12 deficiency -  History of vitamin B12 deficiency         07/20/2024    9:05 AM  Depression screen PHQ 2/9  Decreased Interest 0  Down, Depressed, Hopeless 0  PHQ - 2 Score 0  Altered sleeping 0  Tired, decreased energy 0  Change in appetite 0  Feeling bad or failure about yourself  0  Trouble concentrating 0  Moving slowly or fidgety/restless 0  Suicidal thoughts 0  PHQ-9 Score 0  Difficult doing work/chores Not difficult at all    Relevant past medical, surgical, family and social history reviewed and updated as indicated. Interim medical history since our last visit reviewed. Allergies and medications reviewed and updated.  Review of Systems  Ten systems reviewed and is negative except as mentioned in HPI      Objective:      BP 138/86   Pulse 80   Temp 98 F (36.7 C)   Ht 6' (1.829 m)   Wt 195 lb (88.5 kg)   SpO2 98%   BMI 26.45 kg/m    Wt Readings from Last 3 Encounters:  10/27/24 195 lb (88.5 kg)  07/20/24 190 lb (86.2 kg)  05/07/24 200 lb (90.7 kg)    Physical Exam GENERAL: Alert, cooperative, well developed, no acute distress. HEENT: Normocephalic, normal oropharynx, moist mucous membranes. CHEST: Clear to auscultation bilaterally, no wheezes, rhonchi, or crackles. CARDIOVASCULAR: Normal heart rate and rhythm, S1 and S2 normal without murmurs. ABDOMEN: Soft, non-tender, non-distended, without organomegaly, normal  bowel sounds. EXTREMITIES: No cyanosis or edema. MUSCULOSKELETAL: No midline tenderness on palpation of spine, no tenderness on palpation of hip, no tenderness on palpation of lower back. No tenderness in left calf, tenderness noted on palpation of posterior thigh NEUROLOGICAL: Cranial nerves grossly intact, moves all extremities without gross motor or sensory deficit.  Results for orders placed or performed in visit on 07/28/24  CBC and differential   Collection Time: 07/18/24 12:00 AM  Result Value Ref Range   Hemoglobin 11.6 (A) 13.5 - 17.5   HCT 40  (A) 41 - 53   Platelets 201 150 - 400 K/uL   WBC 5.4   CBC   Collection Time: 07/18/24 12:00 AM  Result Value Ref Range   RBC 5.21 (A) 3.87 - 5.11          Assessment & Plan:   Problem List Items Addressed This Visit       Digestive   Gastroesophageal reflux disease without esophagitis - Primary     Other   Iron  deficiency anemia due to chronic blood loss   Chronic left shoulder pain   Relevant Medications   celecoxib (CELEBREX) 100 MG capsule   methocarbamol (ROBAXIN) 500 MG tablet   Other Relevant Orders   Ambulatory referral to Orthopedic Surgery   Palpitations   Relevant Orders   TSH   B12 deficiency   Relevant Orders   Vitamin B12   Other Visit Diagnoses       Immunization due         Screening for deficiency anemia       Relevant Orders   CBC with Differential/Platelet     Screening for cholesterol level       Relevant Orders   Lipid panel     Screening for diabetes mellitus       Relevant Orders   Comprehensive metabolic panel with GFR   Hemoglobin A1c     Left leg pain       Relevant Medications   celecoxib (CELEBREX) 100 MG capsule   methocarbamol (ROBAXIN) 500 MG tablet   Other Relevant Orders   Ambulatory referral to Orthopedic Surgery        Assessment and Plan Assessment & Plan Iron  deficiency anemia Currently managed with daily iron  supplementation. No acute issues reported. - Recheck iron  panel.  Gastroesophageal reflux disease (GERD) Well-controlled with current medication regimen. No recent exacerbations reported.  Palpitations Intermittent palpitations managed with propranolol  10 mg twice daily as needed. No recent episodes reported.  Chronic left shoulder pain Chronic pain with no abnormalities on previous x-ray. Pain remains unchanged. Discussed options for physical therapy or orthopedic referral. He prefers [facility] for convenience. - Refer to [facility] for evaluation and potential physical therapy.  Left posterior  thigh pain, possible radiculopathy Pain persisting for over a month, described as pressure and pain similar to a Charlie horse. Initially started in the calf and moved to the thigh. No recent back injury, but history of sciatic nerve issues. Differential includes radiculopathy possibly originating from the back. Discussed insurance requirements for physical therapy prior to MRI. Explained that physical therapy may resolve symptoms, but if not, an MRI may be warranted. - Prescribe Celebrex for pain management. - Prescribe muscle relaxer for leg pain. - Refer to orthopedics for further evaluation and potential physical therapy.  History of B12 deficiency No acute issues reported. - Recheck B12 levels.        Follow up plan: Return in about 6 months (around 04/26/2025)  for follow up.

## 2024-10-28 LAB — CBC WITH DIFFERENTIAL/PLATELET
Absolute Lymphocytes: 2301 {cells}/uL (ref 850–3900)
Absolute Monocytes: 312 {cells}/uL (ref 200–950)
Basophils Absolute: 70 {cells}/uL (ref 0–200)
Basophils Relative: 1.6 %
Eosinophils Absolute: 273 {cells}/uL (ref 15–500)
Eosinophils Relative: 6.2 %
HCT: 40.3 % (ref 38.5–50.0)
Hemoglobin: 11.9 g/dL — ABNORMAL LOW (ref 13.2–17.1)
MCH: 21.8 pg — ABNORMAL LOW (ref 27.0–33.0)
MCHC: 29.5 g/dL — ABNORMAL LOW (ref 32.0–36.0)
MCV: 73.8 fL — ABNORMAL LOW (ref 80.0–100.0)
MPV: 11.1 fL (ref 7.5–12.5)
Monocytes Relative: 7.1 %
Neutro Abs: 1443 {cells}/uL — ABNORMAL LOW (ref 1500–7800)
Neutrophils Relative %: 32.8 %
Platelets: 243 Thousand/uL (ref 140–400)
RBC: 5.46 Million/uL (ref 4.20–5.80)
RDW: 16.2 % — ABNORMAL HIGH (ref 11.0–15.0)
Total Lymphocyte: 52.3 %
WBC: 4.4 Thousand/uL (ref 3.8–10.8)

## 2024-10-28 LAB — COMPREHENSIVE METABOLIC PANEL WITH GFR
AG Ratio: 1.8 (calc) (ref 1.0–2.5)
ALT: 19 U/L (ref 9–46)
AST: 26 U/L (ref 10–40)
Albumin: 4.7 g/dL (ref 3.6–5.1)
Alkaline phosphatase (APISO): 78 U/L (ref 36–130)
BUN: 15 mg/dL (ref 7–25)
CO2: 27 mmol/L (ref 20–32)
Calcium: 9.8 mg/dL (ref 8.6–10.3)
Chloride: 106 mmol/L (ref 98–110)
Creat: 1.2 mg/dL (ref 0.60–1.26)
Globulin: 2.6 g/dL (ref 1.9–3.7)
Glucose, Bld: 83 mg/dL (ref 65–99)
Potassium: 5 mmol/L (ref 3.5–5.3)
Sodium: 140 mmol/L (ref 135–146)
Total Bilirubin: 0.3 mg/dL (ref 0.2–1.2)
Total Protein: 7.3 g/dL (ref 6.1–8.1)
eGFR: 80 mL/min/1.73m2 (ref >=60)

## 2024-10-28 LAB — IRON,TIBC AND FERRITIN PANEL
%SAT: 5 % — ABNORMAL LOW (ref 20–48)
Ferritin: 5 ng/mL — ABNORMAL LOW (ref 38–380)
Iron: 21 ug/dL — ABNORMAL LOW (ref 50–180)
TIBC: 405 ug/dL (ref 250–425)

## 2024-10-28 LAB — LIPID PANEL
Cholesterol: 183 mg/dL (ref <200)
HDL: 42 mg/dL (ref >=40)
LDL Cholesterol (Calc): 121 mg/dL — ABNORMAL HIGH
Non-HDL Cholesterol (Calc): 141 mg/dL — ABNORMAL HIGH (ref <130)
Total CHOL/HDL Ratio: 4.4 (calc) (ref <5.0)
Triglycerides: 97 mg/dL (ref <150)

## 2024-10-28 LAB — VITAMIN B12: Vitamin B-12: 326 pg/mL (ref 200–1100)

## 2024-10-28 LAB — HEMOGLOBIN A1C
Hgb A1c MFr Bld: 5.7 % — ABNORMAL HIGH (ref <5.7)
Mean Plasma Glucose: 117 mg/dL
eAG (mmol/L): 6.5 mmol/L

## 2024-10-28 LAB — TSH: TSH: 1.03 m[IU]/L (ref 0.40–4.50)

## 2024-10-31 ENCOUNTER — Ambulatory Visit: Payer: Self-pay | Admitting: Nurse Practitioner

## 2024-10-31 DIAGNOSIS — D509 Iron deficiency anemia, unspecified: Secondary | ICD-10-CM

## 2024-11-06 ENCOUNTER — Inpatient Hospital Stay: Attending: Oncology | Admitting: Oncology

## 2024-11-06 ENCOUNTER — Inpatient Hospital Stay

## 2024-11-06 ENCOUNTER — Encounter: Payer: Self-pay | Admitting: Oncology

## 2024-11-06 VITALS — BP 136/83 | HR 73 | Temp 97.5°F | Resp 18 | Ht 72.0 in | Wt 200.0 lb

## 2024-11-06 DIAGNOSIS — D509 Iron deficiency anemia, unspecified: Secondary | ICD-10-CM | POA: Insufficient documentation

## 2024-11-06 DIAGNOSIS — F17210 Nicotine dependence, cigarettes, uncomplicated: Secondary | ICD-10-CM | POA: Diagnosis not present

## 2024-11-06 DIAGNOSIS — D500 Iron deficiency anemia secondary to blood loss (chronic): Secondary | ICD-10-CM

## 2024-11-06 NOTE — Progress Notes (Signed)
 [facility]  [phone]) [phone] [phone]) [phone]  ID: Douglas Patton OB: Mar 30, 1986  MR#: [id number]  RDW#:[id number]  Patient Care Team: Gareth Mliss FALCON, FNP as PCP - General (Nurse Practitioner)  CHIEF COMPLAINT: Iron  deficiency anemia.  INTERVAL HISTORY: Patient is a 38 year old male with a history of iron  deficiency anemia requiring IV iron  infusions who is referred for further evaluation and continuation of treatment.  He currently feels well and is asymptomatic.  He has no neurologic complaints.  He denies any recent fevers or illnesses.  He has a good appetite and denies weight loss.  He has no chest pain, shortness of breath, cough, or hemoptysis.  He denies any nausea, vomiting, constipation, or diarrhea.  He has no melena or hematochezia.  He has no urinary complaints.  Patient offers no further specific complaints today.  REVIEW OF SYSTEMS:   Review of Systems  Constitutional: Negative.  Negative for fever, malaise/fatigue and weight loss.  Respiratory: Negative.  Negative for cough, hemoptysis and shortness of breath.   Cardiovascular: Negative.  Negative for chest pain and leg swelling.  Gastrointestinal: Negative.  Negative for abdominal pain, blood in stool and melena.  Genitourinary: Negative.  Negative for hematuria.  Musculoskeletal: Negative.  Negative for back pain.  Skin: Negative.  Negative for rash.  Neurological: Negative.  Negative for dizziness, focal weakness, weakness and headaches.  Psychiatric/Behavioral: Negative.  The patient is not nervous/anxious.     As per HPI. Otherwise, a complete review of systems is negative.  PAST MEDICAL HISTORY: Past Medical History:  Diagnosis Date   Anemia    Bronchitis    Tobacco abuse     PAST SURGICAL HISTORY: Past Surgical History:  Procedure Laterality Date   COLONOSCOPY N/A 03/04/2018   Procedure: COLONOSCOPY;  Surgeon: Unk Corinn Skiff, MD;  Location: [facility] ENDOSCOPY;  Service:  Gastroenterology;  Laterality: N/A;   COLONOSCOPY N/A 05/19/2020   Procedure: COLONOSCOPY;  Surgeon: Toledo, Ladell POUR, MD;  Location: [facility] ENDOSCOPY;  Service: Gastroenterology;  Laterality: N/A;   COLONOSCOPY N/A 09/02/2022   Procedure: COLONOSCOPY;  Surgeon: Toledo, Ladell POUR, MD;  Location: [facility] ENDOSCOPY;  Service: Gastroenterology;  Laterality: N/A;   ESOPHAGOGASTRODUODENOSCOPY N/A 03/04/2018   Procedure: ESOPHAGOGASTRODUODENOSCOPY (EGD);  Surgeon: Unk Corinn Skiff, MD;  Location: [facility] ENDOSCOPY;  Service: Gastroenterology;  Laterality: N/A;   ESOPHAGOGASTRODUODENOSCOPY N/A 05/19/2020   Procedure: ESOPHAGOGASTRODUODENOSCOPY (EGD);  Surgeon: Toledo, Ladell POUR, MD;  Location: [facility] ENDOSCOPY;  Service: Gastroenterology;  Laterality: N/A;   ESOPHAGOGASTRODUODENOSCOPY N/A 09/02/2022   Procedure: ESOPHAGOGASTRODUODENOSCOPY (EGD);  Surgeon: Toledo, Ladell POUR, MD;  Location: [facility] ENDOSCOPY;  Service: Gastroenterology;  Laterality: N/A;   NO PAST SURGERIES      FAMILY HISTORY: Family History  Problem Relation Age of Onset   Diabetes Mellitus II Mother    CVA Father     ADVANCED DIRECTIVES (Y/N):  N  HEALTH MAINTENANCE: Social History   Tobacco Use   Smoking status: Every Day    Types: Cigarettes   Smokeless tobacco: Never  Vaping Use   Vaping status: Former  Substance Use Topics   Alcohol use: Not Currently    Comment: occ   Drug use: Not Currently    Frequency: 7.0 times per week    Types: Marijuana     Colonoscopy:  PAP:  Bone density:  Lipid panel:  No Known Allergies  Current Outpatient Medications  Medication Sig Dispense Refill   celecoxib (CELEBREX) 100 MG capsule Take 1 capsule (100 mg total) by mouth 2 (two) times  daily. 60 capsule 0   gabapentin (NEURONTIN) 300 MG capsule Take 1 capsule 3 times a day by oral route for 30 days.     iron  polysaccharides (NIFEREX) 150 MG capsule Take 1 capsule (150 mg total) by mouth 2 (two) times daily. 120 capsule 0    methocarbamol (ROBAXIN) 500 MG tablet Take 1 tablet (500 mg total) by mouth 3 (three) times daily. 90 tablet 0   naproxen  (NAPROSYN ) 500 MG tablet TAKE 1 TABLET BY MOUTH TWICE DAILY WITH A MEAL FOR 10 DAYS     omeprazole  (PRILOSEC) 20 MG capsule Take 1 capsule (20 mg total) by mouth daily. 90 capsule 1   propranolol  (INDERAL ) 10 MG tablet Take 1 tablet (10 mg total) by mouth 2 (two) times daily as needed (palpitations). 180 tablet 1   No current facility-administered medications for this visit.    OBJECTIVE: Vitals:   11/06/24 1516  BP: 136/83  Pulse: 73  Resp: 18  Temp: (!) 97.5 F (36.4 C)  SpO2: 100%     Body mass index is 27.12 kg/m.    ECOG FS:0 - Asymptomatic  General: Well-developed, well-nourished, no acute distress. Eyes: Pink conjunctiva, anicteric sclera. HEENT: Normocephalic, moist mucous membranes. Lungs: No audible wheezing or coughing. Heart: Regular rate and rhythm. Abdomen: Soft, nontender, no obvious distention. Musculoskeletal: No edema, cyanosis, or clubbing. Neuro: Alert, answering all questions appropriately. Cranial nerves grossly intact. Skin: No rashes or petechiae noted. Psych: Normal affect. Lymphatics: No cervical, calvicular, axillary or inguinal LAD.   LAB RESULTS:  Lab Results  Component Value Date   NA 140 10/27/2024   K 5.0 10/27/2024   CL 106 10/27/2024   CO2 27 10/27/2024   GLUCOSE 83 10/27/2024   BUN 15 10/27/2024   CREATININE 1.20 10/27/2024   CALCIUM 9.8 10/27/2024   PROT 7.3 10/27/2024   ALBUMIN 3.4 (L) 09/01/2022   AST 26 10/27/2024   ALT 19 10/27/2024   ALKPHOS 42 09/01/2022   BILITOT 0.3 10/27/2024   GFRNONAA >60 05/07/2024   GFRAA >60 05/19/2020    Lab Results  Component Value Date   WBC 4.4 10/27/2024   NEUTROABS 1,443 (L) 10/27/2024   HGB 11.9 (L) 10/27/2024   HCT 40.3 10/27/2024   MCV 73.8 (L) 10/27/2024   PLT 243 10/27/2024   Lab Results  Component Value Date   IRON  21 (L) 10/27/2024   TIBC 405  10/27/2024   IRONPCTSAT 5 (L) 10/27/2024   Lab Results  Component Value Date   FERRITIN 5 (L) 10/27/2024     STUDIES: No results found.  ASSESSMENT: Iron  deficiency anemia.  PLAN:    Iron  deficiency anemia: Unclear etiology.  Patient's most recent colonoscopy and EGD on September 02, 2022 did not reveal any significant pathology.  Although his hemoglobin is only mildly reduced at 11.9, he has significantly reduced iron  stores and will benefit from IV Venofer .  He last received treatment on September 03, 2022.  Return to clinic 5 times over the next 1 to 2 weeks to receive 200 mg IV Venofer .  Patient would then return to clinic in 4 months with repeat laboratory, further evaluation, and continuation of treatment if needed.  I spent a total of 45 minutes reviewing chart data, face-to-face evaluation with the patient, counseling and coordination of care as detailed above.   Patient expressed understanding and was in agreement with this plan. He also understands that He can call clinic at any time with any questions, concerns, or complaints.  Evalene JINNY Reusing, MD   11/06/2024 4:27 PM

## 2024-11-06 NOTE — Progress Notes (Signed)
 Patient has had iron  infusion in 2023. He has a little bit of nausea.

## 2024-11-14 ENCOUNTER — Inpatient Hospital Stay

## 2024-11-14 VITALS — BP 130/80 | HR 66 | Temp 97.8°F | Resp 18

## 2024-11-14 DIAGNOSIS — D509 Iron deficiency anemia, unspecified: Secondary | ICD-10-CM | POA: Diagnosis not present

## 2024-11-14 DIAGNOSIS — D500 Iron deficiency anemia secondary to blood loss (chronic): Secondary | ICD-10-CM

## 2024-11-14 MED ORDER — SODIUM CHLORIDE 0.9% FLUSH
10.0000 mL | Freq: Once | INTRAVENOUS | Status: AC | PRN
  Administered 2024-11-14: 10 mL
  Filled 2024-11-14: qty 10

## 2024-11-14 MED ORDER — IRON SUCROSE 20 MG/ML IV SOLN
200.0000 mg | Freq: Once | INTRAVENOUS | Status: AC
  Administered 2024-11-14: 200 mg via INTRAVENOUS
  Filled 2024-11-14: qty 10

## 2024-11-21 ENCOUNTER — Inpatient Hospital Stay

## 2024-11-21 VITALS — BP 133/81 | HR 86 | Temp 97.9°F

## 2024-11-21 DIAGNOSIS — D509 Iron deficiency anemia, unspecified: Secondary | ICD-10-CM | POA: Diagnosis not present

## 2024-11-21 DIAGNOSIS — D500 Iron deficiency anemia secondary to blood loss (chronic): Secondary | ICD-10-CM

## 2024-11-21 MED ORDER — IRON SUCROSE 20 MG/ML IV SOLN
200.0000 mg | Freq: Once | INTRAVENOUS | Status: AC
  Administered 2024-11-21: 200 mg via INTRAVENOUS
  Filled 2024-11-21: qty 10

## 2024-11-21 NOTE — Patient Instructions (Signed)

## 2024-11-28 ENCOUNTER — Inpatient Hospital Stay: Attending: Oncology

## 2024-11-28 VITALS — BP 124/84 | HR 64 | Temp 98.4°F | Resp 16

## 2024-11-28 DIAGNOSIS — D509 Iron deficiency anemia, unspecified: Secondary | ICD-10-CM | POA: Insufficient documentation

## 2024-11-28 DIAGNOSIS — D500 Iron deficiency anemia secondary to blood loss (chronic): Secondary | ICD-10-CM

## 2024-11-28 MED ORDER — IRON SUCROSE 20 MG/ML IV SOLN
200.0000 mg | Freq: Once | INTRAVENOUS | Status: AC
  Administered 2024-11-28: 200 mg via INTRAVENOUS
  Filled 2024-11-28: qty 10

## 2024-11-28 NOTE — Patient Instructions (Signed)

## 2024-11-30 ENCOUNTER — Inpatient Hospital Stay

## 2024-11-30 VITALS — BP 140/75 | HR 71 | Temp 98.2°F | Resp 18

## 2024-11-30 DIAGNOSIS — D509 Iron deficiency anemia, unspecified: Secondary | ICD-10-CM | POA: Diagnosis not present

## 2024-11-30 DIAGNOSIS — D500 Iron deficiency anemia secondary to blood loss (chronic): Secondary | ICD-10-CM

## 2024-11-30 MED ORDER — SODIUM CHLORIDE 0.9% FLUSH
10.0000 mL | Freq: Once | INTRAVENOUS | Status: AC | PRN
  Administered 2024-11-30: 10 mL
  Filled 2024-11-30: qty 10

## 2024-11-30 MED ORDER — IRON SUCROSE 20 MG/ML IV SOLN
200.0000 mg | Freq: Once | INTRAVENOUS | Status: AC
  Administered 2024-11-30: 200 mg via INTRAVENOUS
  Filled 2024-11-30: qty 10

## 2024-12-05 ENCOUNTER — Inpatient Hospital Stay

## 2024-12-05 VITALS — BP 121/64 | HR 71 | Temp 97.8°F

## 2024-12-05 DIAGNOSIS — D509 Iron deficiency anemia, unspecified: Secondary | ICD-10-CM | POA: Diagnosis not present

## 2024-12-05 DIAGNOSIS — D500 Iron deficiency anemia secondary to blood loss (chronic): Secondary | ICD-10-CM

## 2024-12-05 MED ORDER — SODIUM CHLORIDE 0.9% FLUSH
10.0000 mL | Freq: Once | INTRAVENOUS | Status: AC | PRN
  Administered 2024-12-05: 10 mL
  Filled 2024-12-05: qty 10

## 2024-12-05 MED ORDER — IRON SUCROSE 20 MG/ML IV SOLN
200.0000 mg | Freq: Once | INTRAVENOUS | Status: AC
  Administered 2024-12-05: 200 mg via INTRAVENOUS

## 2024-12-05 NOTE — Progress Notes (Signed)
Patient tolerated infusion well, no questions/concerns voiced. Monitored 30 min post transfusion. Patient stable at discharge. VSS. AVS given.    

## 2024-12-05 NOTE — Patient Instructions (Signed)

## 2025-03-13 ENCOUNTER — Inpatient Hospital Stay

## 2025-03-14 ENCOUNTER — Inpatient Hospital Stay: Admitting: Oncology

## 2025-03-14 ENCOUNTER — Inpatient Hospital Stay
